# Patient Record
Sex: Female | Born: 1990 | Race: Black or African American | Hispanic: No | Marital: Single | Smoking: Never smoker
Health system: Southern US, Community
[De-identification: ages and names within clinical notes are randomized; demographics above are authoritative.]

## PROBLEM LIST (undated history)

## (undated) ENCOUNTER — Inpatient Hospital Stay: Payer: Self-pay

## (undated) DIAGNOSIS — E876 Hypokalemia: Secondary | ICD-10-CM

## (undated) DIAGNOSIS — F1290 Cannabis use, unspecified, uncomplicated: Secondary | ICD-10-CM

## (undated) DIAGNOSIS — F32A Depression, unspecified: Secondary | ICD-10-CM

## (undated) DIAGNOSIS — D649 Anemia, unspecified: Secondary | ICD-10-CM

## (undated) DIAGNOSIS — F419 Anxiety disorder, unspecified: Secondary | ICD-10-CM

## (undated) DIAGNOSIS — R748 Abnormal levels of other serum enzymes: Secondary | ICD-10-CM

## (undated) DIAGNOSIS — O0930 Supervision of pregnancy with insufficient antenatal care, unspecified trimester: Secondary | ICD-10-CM

## (undated) DIAGNOSIS — K8063 Calculus of gallbladder and bile duct with acute cholecystitis with obstruction: Secondary | ICD-10-CM

## (undated) DIAGNOSIS — O09299 Supervision of pregnancy with other poor reproductive or obstetric history, unspecified trimester: Secondary | ICD-10-CM

## (undated) DIAGNOSIS — K219 Gastro-esophageal reflux disease without esophagitis: Secondary | ICD-10-CM

## (undated) HISTORY — PX: KELOID EXCISION: SHX1856

---
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
## 2014-09-20 DIAGNOSIS — O321XX0 Maternal care for breech presentation, not applicable or unspecified: Secondary | ICD-10-CM

## 2017-06-18 ENCOUNTER — Emergency Department
Admission: EM | Admit: 2017-06-18 | Discharge: 2017-06-18 | Disposition: A | Discharge status: Home / Self Care | Payer: Self-pay | Attending: Emergency Medicine | Admitting: Emergency Medicine

## 2017-06-18 ENCOUNTER — Encounter: Payer: Self-pay | Admitting: Emergency Medicine

## 2017-06-18 DIAGNOSIS — B349 Viral infection, unspecified: Secondary | ICD-10-CM | POA: Insufficient documentation

## 2017-06-18 DIAGNOSIS — R509 Fever, unspecified: Secondary | ICD-10-CM | POA: Insufficient documentation

## 2017-06-18 DIAGNOSIS — R112 Nausea with vomiting, unspecified: Secondary | ICD-10-CM | POA: Insufficient documentation

## 2017-06-18 DIAGNOSIS — R197 Diarrhea, unspecified: Secondary | ICD-10-CM | POA: Insufficient documentation

## 2017-06-18 LAB — CBC
HCT: 36.9 % (ref 35.0–47.0)
Hemoglobin: 12.3 g/dL (ref 12.0–16.0)
MCH: 25.7 pg — ABNORMAL LOW (ref 26.0–34.0)
MCHC: 33.4 g/dL (ref 32.0–36.0)
MCV: 76.9 fL — ABNORMAL LOW (ref 80.0–100.0)
PLATELETS: 280 10*3/uL (ref 150–440)
RBC: 4.79 MIL/uL (ref 3.80–5.20)
RDW: 15.2 % — AB (ref 11.5–14.5)
WBC: 8.8 10*3/uL (ref 3.6–11.0)

## 2017-06-18 LAB — URINALYSIS, COMPLETE (UACMP) WITH MICROSCOPIC
BILIRUBIN URINE: NEGATIVE
Bacteria, UA: NONE SEEN
Glucose, UA: NEGATIVE mg/dL
HGB URINE DIPSTICK: NEGATIVE
Ketones, ur: 5 mg/dL — AB
Nitrite: NEGATIVE
PH: 6 (ref 5.0–8.0)
Protein, ur: 30 mg/dL — AB
SPECIFIC GRAVITY, URINE: 1.028 (ref 1.005–1.030)

## 2017-06-18 LAB — COMPREHENSIVE METABOLIC PANEL
ALT: 13 U/L — AB (ref 14–54)
AST: 14 U/L — AB (ref 15–41)
Albumin: 4.2 g/dL (ref 3.5–5.0)
Alkaline Phosphatase: 69 U/L (ref 38–126)
Anion gap: 10 (ref 5–15)
BILIRUBIN TOTAL: 0.6 mg/dL (ref 0.3–1.2)
BUN: 7 mg/dL (ref 6–20)
CO2: 25 mmol/L (ref 22–32)
Calcium: 9.2 mg/dL (ref 8.9–10.3)
Chloride: 103 mmol/L (ref 101–111)
Creatinine, Ser: 0.72 mg/dL (ref 0.44–1.00)
GFR calc Af Amer: >60 mL/min (ref >60)
Glucose, Bld: 92 mg/dL (ref 65–99)
Potassium: 3.6 mmol/L (ref 3.5–5.1)
Sodium: 138 mmol/L (ref 135–145)
TOTAL PROTEIN: 7.9 g/dL (ref 6.5–8.1)

## 2017-06-18 LAB — LIPASE, BLOOD: Lipase: 15 U/L (ref 11–51)

## 2017-06-18 LAB — POCT PREGNANCY, URINE: Preg Test, Ur: NEGATIVE

## 2017-06-18 MED ORDER — ONDANSETRON 4 MG PO TBDP
4.0000 mg | ORAL_TABLET | Freq: Once | ORAL | Status: AC
  Administered 2017-06-18: 4 mg via ORAL

## 2017-06-18 MED ORDER — ONDANSETRON 4 MG PO TBDP: ORAL_TABLET | ORAL | 0 refills | Status: DC

## 2017-06-18 MED ORDER — ONDANSETRON 4 MG PO TBDP
ORAL_TABLET | ORAL | Status: AC
  Filled 2017-06-18: qty 1

## 2017-06-18 NOTE — Discharge Instructions (Signed)

## 2017-06-18 NOTE — ED Triage Notes (Signed)
Pt to ed with c/o vomiting and diarrhea x 2 days with abd pain and fever.

## 2017-06-18 NOTE — ED Notes (Signed)
Pt tolerating fluids, denies nausea at this time.  

## 2017-06-18 NOTE — ED Provider Notes (Addendum)
[facility] Emergency Department Provider Note  ____________________________________________   First MD Initiated Contact with Patient 06/18/17 1349     (approximate)  I have reviewed the triage vital signs and the nursing notes.   HISTORY  Chief Complaint Emesis; Diarrhea; and Fever    HPI Susan Oneal is a 26 y.o. female With no chronic medical issues who presents for evaluation of about 4 days ago Friday if symptoms that include some nasal congestion and runny nose, mild cough occasionally productive of white sputum, and about 2 daysof nausea, vomiting, and diarrhea.  She reports that the respiratory symptoms started, but then seemed to have "moved down" and developed into the vomiting and diarrhea.  She reports that over the last 2 days she had about 4 episodes of vomiting each day and about 3 loose stools.    Nothing in particular makes the patient's symptoms better nor worse.  She is not having any abdominal pain except that yesterday she noticed that her muscles are sore when she is vomiting.  She feels fine otherwise and denies fever/chills, neck pain, headache, shortness of breath, chest pain, and dysuria.  She reports that the symptoms are moderate in general but that the vomiting has been severe.  However she has not vomited today but she has not tried eating or drinking very much.  She has no nausea at this time.  She reports that she works at [employer] and comes into contact with a lot of patients and other providers and staff.   History reviewed. No pertinent past medical history.  There are no active problems to display for this patient.   History reviewed. No pertinent surgical history.  Prior to Admission medications   Medication Sig Start Date End Date Taking? Authorizing Provider  ondansetron (ZOFRAN ODT) 4 MG disintegrating tablet Allow 1-2 tablets to dissolve in your mouth every 8 hours as needed for nausea/vomiting 06/18/17   Loleta Rose, MD    Allergies Patient has no known allergies.  History reviewed. No pertinent family history.  Social History Social History  Substance Use Topics  . Smoking status: Never Smoker  . Smokeless tobacco: Never Used  . Alcohol use No    Review of Systems Constitutional: No fever/chills Eyes: No visual changes. ENT: No sore throat. some mild nasal congestion and runny nose that seems to be improving Cardiovascular: Denies chest pain. Respiratory: Denies shortness of breath. mild cough occasionally productive of some white sputum but that has been improving Gastrointestinal: No abdominal pain.  several days of nausea, vomiting, and diarrhea, although it seems improved today Genitourinary: Negative for dysuria. Musculoskeletal: Negative for neck pain.  Negative for back pain. Integumentary: Negative for rash. Neurological: Negative for headaches, focal weakness or numbness.   ____________________________________________   PHYSICAL EXAM:  VITAL SIGNS: ED Triage Vitals  Enc Vitals Group     BP 06/18/17 1142 125/78     Pulse Rate 06/18/17 1142 92     Resp 06/18/17 1142 18     Temp 06/18/17 1142 98.2 F (36.8 C)     Temp Source 06/18/17 1142 Oral     SpO2 06/18/17 1142 100 %     Weight 06/18/17 1142 81.2 kg (179 lb)     Height --      Head Circumference --      Peak Flow --      Pain Score 06/18/17 1141 7     Pain Loc --      Pain  Edu? --      Excl. in GC? --     Constitutional: Alert and oriented. Well appearing and in no acute distress. Eyes: Conjunctivae are normal.  Head: Atraumatic. Nose: No congestion/rhinnorhea. Mouth/Throat: Mucous membranes are moist. Neck: No stridor.  No meningeal signs.   Cardiovascular: Normal rate, regular rhythm. Good peripheral circulation. Grossly normal heart sounds. Respiratory: Normal respiratory effort.  No retractions. Lungs CTAB. Gastrointestinal: Soft and nontender throughout all quadrants, specifically with no right  upper quadrant tenderness, negative Murphy sign, and no tenderness at McBurney's point.  No rebound or guarding. No distention.  GU:  deferred Musculoskeletal: No lower extremity tenderness nor edema. No gross deformities of extremities. Neurologic:  Normal speech and language. No gross focal neurologic deficits are appreciated.  Skin:  Skin is warm, dry and intact. No rash noted. Psychiatric: Mood and affect are normal. Speech and behavior are normal.  ____________________________________________   LABS (all labs ordered are listed, but only abnormal results are displayed)  Labs Reviewed  COMPREHENSIVE METABOLIC PANEL - Abnormal; Notable for the following:       Result Value   AST 14 (*)    ALT 13 (*)    All other components within normal limits  CBC - Abnormal; Notable for the following:    MCV 76.9 (*)    MCH 25.7 (*)    RDW 15.2 (*)    All other components within normal limits  URINALYSIS, COMPLETE (UACMP) WITH MICROSCOPIC - Abnormal; Notable for the following:    Color, Urine AMBER (*)    APPearance HAZY (*)    Ketones, ur 5 (*)    Protein, ur 30 (*)    Leukocytes, UA SMALL (*)    Squamous Epithelial / LPF 6-30 (*)    All other components within normal limits  URINE CULTURE  LIPASE, BLOOD  POC URINE PREG, ED  POCT PREGNANCY, URINE   ____________________________________________  EKG  None - EKG not ordered by ED physician ____________________________________________  RADIOLOGY   No results found.  ____________________________________________   PROCEDURES  Critical Care performed: No   Procedure(s) performed:   Procedures   ____________________________________________   INITIAL IMPRESSION / ASSESSMENT AND PLAN / ED COURSE  Pertinent labs & imaging results that were available during my care of the patient were reviewed by me and considered in my medical decision making (see chart for details).  the patient is quite well-appearing today and states  that she feels well too, she is just concerned because of all the vomiting over the last couple of days.  It sounds like she has a viral illness similar to that which we have seen in the community (and ED staff) recently that starts with upper respiratory symptoms and develops into some GI symptoms.  There is no sign of acute or emergent medical condition.  Differential diagnosis includes but is not limited to gallbladder disease, gastritis, nonspecific colitis, etc., but she has no tenderness to palpation of the abdomen and I suspect that with a Zofran she will be able to tolerate oral intake.  I counseled her about this and I will discharge her with Zofran.  I gave my usual and customary return precautions.  She understands and agrees with the plan.  I personally reviewed her lab results and they are only notable for some squamous cells in the urine and 5 ketones but as long as she is tolerating oral intake she does not require IV hydration at this time.  Urine pregnancy test is negative  and metabolic panel is within normal limits other than a very slightly low AST and ALT.   Clinical Course as of Jun 18 1442  Thu Jun 18, 2017  1441 Patient tolerated PO challenge.  Will d/c as planned.  Gave usual/customary return precautions.  [CF]    Clinical Course User Index [CF] Loleta Rose, MD    ____________________________________________  FINAL CLINICAL IMPRESSION(S) / ED DIAGNOSES  Final diagnoses:  Viral illness  Nausea vomiting and diarrhea     MEDICATIONS GIVEN DURING THIS VISIT:  Medications  ondansetron (ZOFRAN-ODT) disintegrating tablet 4 mg (4 mg Oral Given 06/18/17 1402)     NEW OUTPATIENT MEDICATIONS STARTED DURING THIS VISIT:  New Prescriptions   ONDANSETRON (ZOFRAN ODT) 4 MG DISINTEGRATING TABLET    Allow 1-2 tablets to dissolve in your mouth every 8 hours as needed for nausea/vomiting    Modified Medications   No medications on file    Discontinued Medications   No  medications on file     Note:  This document was prepared using Dragon voice recognition software and may include unintentional dictation errors.    Loleta Rose, MD 06/18/17 1406    Loleta Rose, MD 06/18/17 [id number]

## 2017-06-18 NOTE — ED Notes (Signed)
Pt given po ice and ginger ale.

## 2017-06-18 NOTE — ED Notes (Signed)
Pt signed paper copy of d/c signature due to computer malfnx

## 2017-06-18 NOTE — ED Notes (Signed)
Pt to ed with c/o vomiting and diarrhea x 2 days.  Pt states vomited x 4 in the last 24 hours and had diarrhea x 3 in the last 24 hours.  Pt also reports burning in her throat and mild abd pain. Skin warm and dry, no vomiting or nausea noted at this time.

## 2017-06-20 LAB — URINE CULTURE: SPECIAL REQUESTS: NORMAL

## 2017-12-14 ENCOUNTER — Other Ambulatory Visit: Payer: Self-pay | Admitting: Internal Medicine

## 2017-12-14 DIAGNOSIS — R109 Unspecified abdominal pain: Secondary | ICD-10-CM

## 2017-12-14 DIAGNOSIS — R634 Abnormal weight loss: Secondary | ICD-10-CM

## 2018-02-10 ENCOUNTER — Encounter: Payer: Self-pay | Admitting: Emergency Medicine

## 2018-02-10 ENCOUNTER — Emergency Department
Admission: EM | Admit: 2018-02-10 | Discharge: 2018-02-10 | Disposition: A | Discharge status: Home / Self Care | Payer: Self-pay | Attending: Emergency Medicine | Admitting: Emergency Medicine

## 2018-02-10 ENCOUNTER — Other Ambulatory Visit: Payer: Self-pay

## 2018-02-10 DIAGNOSIS — K529 Noninfective gastroenteritis and colitis, unspecified: Secondary | ICD-10-CM | POA: Insufficient documentation

## 2018-02-10 LAB — COMPREHENSIVE METABOLIC PANEL
ALT: 9 U/L — ABNORMAL LOW (ref 14–54)
ANION GAP: 7 (ref 5–15)
AST: 12 U/L — AB (ref 15–41)
Albumin: 3.8 g/dL (ref 3.5–5.0)
Alkaline Phosphatase: 59 U/L (ref 38–126)
BUN: 7 mg/dL (ref 6–20)
CHLORIDE: 103 mmol/L (ref 101–111)
CO2: 27 mmol/L (ref 22–32)
Calcium: 9 mg/dL (ref 8.9–10.3)
Creatinine, Ser: 0.68 mg/dL (ref 0.44–1.00)
Glucose, Bld: 95 mg/dL (ref 65–99)
POTASSIUM: 3.8 mmol/L (ref 3.5–5.1)
Sodium: 137 mmol/L (ref 135–145)
TOTAL PROTEIN: 7.4 g/dL (ref 6.5–8.1)
Total Bilirubin: 0.7 mg/dL (ref 0.3–1.2)

## 2018-02-10 LAB — CBC
HEMATOCRIT: 35.1 % (ref 35.0–47.0)
HEMOGLOBIN: 11.5 g/dL — AB (ref 12.0–16.0)
MCH: 27 pg (ref 26.0–34.0)
MCHC: 32.8 g/dL (ref 32.0–36.0)
MCV: 82.3 fL (ref 80.0–100.0)
Platelets: 296 10*3/uL (ref 150–440)
RBC: 4.26 MIL/uL (ref 3.80–5.20)
RDW: 15.3 % — ABNORMAL HIGH (ref 11.5–14.5)
WBC: 7.6 10*3/uL (ref 3.6–11.0)

## 2018-02-10 LAB — URINALYSIS, COMPLETE (UACMP) WITH MICROSCOPIC
Bacteria, UA: NONE SEEN
Bilirubin Urine: NEGATIVE
Glucose, UA: NEGATIVE mg/dL
Hgb urine dipstick: NEGATIVE
KETONES UR: NEGATIVE mg/dL
Leukocytes, UA: NEGATIVE
Nitrite: NEGATIVE
PH: 7 (ref 5.0–8.0)
Protein, ur: NEGATIVE mg/dL
SPECIFIC GRAVITY, URINE: 1.018 (ref 1.005–1.030)

## 2018-02-10 LAB — POCT PREGNANCY, URINE: Preg Test, Ur: NEGATIVE

## 2018-02-10 LAB — LIPASE, BLOOD: LIPASE: 20 U/L (ref 11–51)

## 2018-02-10 MED ORDER — ONDANSETRON HCL 4 MG PO TABS: 4.0000 mg | ORAL_TABLET | Freq: Three times a day (TID) | ORAL | 0 refills | Status: AC | PRN

## 2018-02-10 MED ORDER — DICYCLOMINE HCL 10 MG PO CAPS: 10.0000 mg | ORAL_CAPSULE | Freq: Three times a day (TID) | ORAL | 0 refills | Status: DC | PRN

## 2018-02-10 MED ORDER — DICYCLOMINE HCL 10 MG PO CAPS
10.0000 mg | ORAL_CAPSULE | Freq: Once | ORAL | Status: AC
  Administered 2018-02-10: 10 mg via ORAL
  Filled 2018-02-10: qty 1

## 2018-02-10 NOTE — ED Provider Notes (Signed)
[facility] Emergency Department Provider Note ____________________________________________   First MD Initiated Contact with Patient 02/10/18 1521     (approximate)  I have reviewed the triage vital signs and the nursing notes.   HISTORY  Chief Complaint Abdominal Pain    HPI Susan Oneal is a 27 y.o. female with apparent past history of cholelithiasis but no other significant medical problems who presents with abdominal pain since last night, described as crampy and generalized, and intermittent.  It is not present currently.  She reports several episodes of vomiting, and a few episodes of nonbloody diarrhea today as well.  She states that symptoms started after she ate fried seafood last night, which she does not normally eat.  She denies fever, chest pain or difficulty breathing, or urinary symptoms.  No sick contacts or other recent illness.  History reviewed. No pertinent past medical history.  There are no active problems to display for this patient.   History reviewed. No pertinent surgical history.  Prior to Admission medications   Medication Sig Start Date End Date Taking? Authorizing Provider  dicyclomine (BENTYL) 10 MG capsule Take 1 capsule (10 mg total) by mouth 3 (three) times daily as needed for up to 5 days (Abdominal cramps). 02/10/18 02/15/18  Dionne Bucy, MD  ondansetron (ZOFRAN ODT) 4 MG disintegrating tablet Allow 1-2 tablets to dissolve in your mouth every 8 hours as needed for nausea/vomiting 06/18/17   Loleta Rose, MD  ondansetron (ZOFRAN) 4 MG tablet Take 1 tablet (4 mg total) by mouth every 8 (eight) hours as needed for up to 4 days for nausea or vomiting. 02/10/18 02/14/18  Dionne Bucy, MD    Allergies Patient has no known allergies.  No family history on file.  Social History Social History   Tobacco Use  . Smoking status: Never Smoker  . Smokeless tobacco: Never Used  Substance Use Topics  . Alcohol  use: No  . Drug use: No    Review of Systems  Constitutional: No fever. Eyes: No redness. ENT: No sore throat. Cardiovascular: Denies chest pain. Respiratory: Denies shortness of breath. Gastrointestinal: Positive for nausea, vomiting, diarrhea.  Genitourinary: Negative for dysuria.  Musculoskeletal: Negative for back pain. Skin: Negative for rash. Neurological: Negative for headache.   ____________________________________________   PHYSICAL EXAM:  VITAL SIGNS: ED Triage Vitals  Enc Vitals Group     BP 02/10/18 1219 115/73     Pulse Rate 02/10/18 1219 68     Resp 02/10/18 1219 18     Temp 02/10/18 1219 98.3 F (36.8 C)     Temp Source 02/10/18 1219 Oral     SpO2 02/10/18 1219 100 %     Weight 02/10/18 1220 139 lb (63 kg)     Height 02/10/18 1220  (1.626 m)     Head Circumference --      Peak Flow --      Pain Score 02/10/18 1219 7     Pain Loc --      Pain Edu? --      Excl. in GC? --     Constitutional: Alert and oriented. Well appearing and in no acute distress. Eyes: Conjunctivae are normal.  No scleral icterus. Head: Atraumatic. Nose: No congestion/rhinnorhea. Mouth/Throat: Mucous membranes are moist.   Neck: Normal range of motion.  Cardiovascular:  Good peripheral circulation. Respiratory: Normal respiratory effort. Gastrointestinal: Soft and nontender. No distention.  Genitourinary: No CVA tenderness. Musculoskeletal:  Extremities warm and well perfused.  Neurologic:  Normal speech and language. No gross focal neurologic deficits are appreciated.  Skin:  Skin is warm and dry. No rash noted. Psychiatric: Mood and affect are normal. Speech and behavior are normal.  ____________________________________________   LABS (all labs ordered are listed, but only abnormal results are displayed)  Labs Reviewed  COMPREHENSIVE METABOLIC PANEL - Abnormal; Notable for the following components:      Result Value   AST 12 (*)    ALT 9 (*)    All other  components within normal limits  CBC - Abnormal; Notable for the following components:   Hemoglobin 11.5 (*)    RDW 15.3 (*)    All other components within normal limits  URINALYSIS, COMPLETE (UACMP) WITH MICROSCOPIC - Abnormal; Notable for the following components:   Color, Urine YELLOW (*)    APPearance HAZY (*)    All other components within normal limits  LIPASE, BLOOD  POC URINE PREG, ED  POCT PREGNANCY, URINE   ____________________________________________  EKG   ____________________________________________  RADIOLOGY    ____________________________________________   PROCEDURES  Procedure(s) performed: No  Procedures  Critical Care performed: No ____________________________________________   INITIAL IMPRESSION / ASSESSMENT AND PLAN / ED COURSE  Pertinent labs & imaging results that were available during my care of the patient were reviewed by me and considered in my medical decision making (see chart for details).  27 year old female with no significant PMH except for cholelithiasis and possible GERD (patient states she has been treated with Prilosec in the past) presents with intermittent crampy generalized abdominal pain, vomiting, and diarrhea, since she ate seafood last night.  On exam here, vital signs are normal, the patient is well-appearing, and her abdomen is soft and nontender.  The remainder the exam is unremarkable.  Past medical records reviewed in Epic and are noncontributory.  Differential includes most likely gastroenteritis versus foodborne illness.  There is no evidence of biliary colic or acute cholecystitis.  Labs obtained from triage are within normal limits.  Given the lack of abdominal tenderness and the reassuring labs, there is no indication for imaging.  Plan: Discharge home with symptomatic treatment with Zofran and Bentyl.  The patient agrees with the plan, and feels well to go home.  Return precautions given, and she expresses  understanding.      ____________________________________________   FINAL CLINICAL IMPRESSION(S) / ED DIAGNOSES  Final diagnoses:  Gastroenteritis      NEW MEDICATIONS STARTED DURING THIS VISIT:  New Prescriptions   DICYCLOMINE (BENTYL) 10 MG CAPSULE    Take 1 capsule (10 mg total) by mouth 3 (three) times daily as needed for up to 5 days (Abdominal cramps).   ONDANSETRON (ZOFRAN) 4 MG TABLET    Take 1 tablet (4 mg total) by mouth every 8 (eight) hours as needed for up to 4 days for nausea or vomiting.     Note:  This document was prepared using Dragon voice recognition software and may include unintentional dictation errors.     Dionne Bucy, MD 02/10/18 1549

## 2018-02-10 NOTE — Discharge Instructions (Signed)
You may take the Zofran as needed for nausea and the Bentyl as needed for abdominal pain or cramping over the next few days.  Return to the ER for new, worsening, persistent severe pain, constant pain, fever, vomiting or inability to take anything by mouth, or any other new or worsening symptoms that concern you.

## 2018-02-10 NOTE — ED Triage Notes (Signed)
Pt states vomiting and diarrhea that began after eating at [employer] last night. States cramping all over in abdomen. Appears in NAD.

## 2018-07-08 ENCOUNTER — Encounter: Payer: Self-pay | Admitting: Emergency Medicine

## 2018-07-08 DIAGNOSIS — Z3A00 Weeks of gestation of pregnancy not specified: Secondary | ICD-10-CM | POA: Insufficient documentation

## 2018-07-08 DIAGNOSIS — K8050 Calculus of bile duct without cholangitis or cholecystitis without obstruction: Secondary | ICD-10-CM | POA: Diagnosis not present

## 2018-07-08 DIAGNOSIS — O26611 Liver and biliary tract disorders in pregnancy, first trimester: Secondary | ICD-10-CM | POA: Diagnosis not present

## 2018-07-08 DIAGNOSIS — O219 Vomiting of pregnancy, unspecified: Secondary | ICD-10-CM | POA: Diagnosis present

## 2018-07-08 LAB — COMPREHENSIVE METABOLIC PANEL
ALK PHOS: 45 U/L (ref 38–126)
ALT: 7 U/L (ref 0–44)
ANION GAP: 7 (ref 5–15)
AST: 13 U/L — ABNORMAL LOW (ref 15–41)
Albumin: 3.7 g/dL (ref 3.5–5.0)
BUN: 5 mg/dL — ABNORMAL LOW (ref 6–20)
CALCIUM: 9 mg/dL (ref 8.9–10.3)
CHLORIDE: 104 mmol/L (ref 98–111)
CO2: 25 mmol/L (ref 22–32)
Creatinine, Ser: 0.59 mg/dL (ref 0.44–1.00)
GFR calc non Af Amer: >60 mL/min (ref >60)
GLUCOSE: 86 mg/dL (ref 70–99)
POTASSIUM: 3.3 mmol/L — AB (ref 3.5–5.1)
SODIUM: 136 mmol/L (ref 135–145)
Total Bilirubin: 0.5 mg/dL (ref 0.3–1.2)
Total Protein: 7.2 g/dL (ref 6.5–8.1)

## 2018-07-08 LAB — CBC
HEMATOCRIT: 35.9 % — AB (ref 36.0–46.0)
HEMOGLOBIN: 12 g/dL (ref 12.0–15.0)
MCH: 29.1 pg (ref 26.0–34.0)
MCHC: 33.4 g/dL (ref 30.0–36.0)
MCV: 87.1 fL (ref 80.0–100.0)
NRBC: 0 % (ref 0.0–0.2)
PLATELETS: 243 10*3/uL (ref 150–400)
RBC: 4.12 MIL/uL (ref 3.87–5.11)
RDW: 14 % (ref 11.5–15.5)
WBC: 10.7 10*3/uL — ABNORMAL HIGH (ref 4.0–10.5)

## 2018-07-08 LAB — URINALYSIS, COMPLETE (UACMP) WITH MICROSCOPIC
BACTERIA UA: NONE SEEN
BILIRUBIN URINE: NEGATIVE
Glucose, UA: NEGATIVE mg/dL
Hgb urine dipstick: NEGATIVE
KETONES UR: NEGATIVE mg/dL
Nitrite: NEGATIVE
Protein, ur: 30 mg/dL — AB
SPECIFIC GRAVITY, URINE: 1.026 (ref 1.005–1.030)
pH: 6 (ref 5.0–8.0)

## 2018-07-08 LAB — POC URINE PREG, ED: PREG TEST UR: POSITIVE — AB

## 2018-07-08 LAB — LIPASE, BLOOD: LIPASE: 22 U/L (ref 11–51)

## 2018-07-08 NOTE — ED Triage Notes (Signed)
Pt arrives with complaints of vomiting. Pt states "it's been happening for awhile." Pt reports issues with vomiting for the last 2 months.  Pt also reports abdominal pain that started a month ago. Pt states the pain feels like cramping. PT unsure if she is pregnant states last period is sept 3rd

## 2018-07-09 ENCOUNTER — Emergency Department: Payer: Medicaid Other

## 2018-07-09 ENCOUNTER — Encounter: Payer: Self-pay | Admitting: Emergency Medicine

## 2018-07-09 ENCOUNTER — Other Ambulatory Visit: Payer: Self-pay

## 2018-07-09 ENCOUNTER — Emergency Department
Admission: EM | Admit: 2018-07-09 | Discharge: 2018-07-09 | Disposition: A | Discharge status: Home / Self Care | Payer: Medicaid Other | Attending: Emergency Medicine | Admitting: Emergency Medicine

## 2018-07-09 ENCOUNTER — Emergency Department
Admission: EM | Admit: 2018-07-09 | Discharge: 2018-07-09 | Disposition: A | Discharge status: Home / Self Care | Payer: Medicaid Other | Source: Home / Self Care | Attending: Emergency Medicine | Admitting: Emergency Medicine

## 2018-07-09 ENCOUNTER — Encounter: Payer: Self-pay | Admitting: Radiology

## 2018-07-09 DIAGNOSIS — R52 Pain, unspecified: Secondary | ICD-10-CM

## 2018-07-09 DIAGNOSIS — O9989 Other specified diseases and conditions complicating pregnancy, childbirth and the puerperium: Secondary | ICD-10-CM | POA: Insufficient documentation

## 2018-07-09 DIAGNOSIS — Z3A18 18 weeks gestation of pregnancy: Secondary | ICD-10-CM

## 2018-07-09 DIAGNOSIS — Z3492 Encounter for supervision of normal pregnancy, unspecified, second trimester: Secondary | ICD-10-CM

## 2018-07-09 DIAGNOSIS — K8050 Calculus of bile duct without cholangitis or cholecystitis without obstruction: Secondary | ICD-10-CM

## 2018-07-09 DIAGNOSIS — R102 Pelvic and perineal pain: Secondary | ICD-10-CM | POA: Insufficient documentation

## 2018-07-09 DIAGNOSIS — Z3A01 Less than 8 weeks gestation of pregnancy: Secondary | ICD-10-CM

## 2018-07-09 LAB — ABO/RH: ABO/RH(D): O POS

## 2018-07-09 LAB — HCG, QUANTITATIVE, PREGNANCY: HCG, BETA CHAIN, QUANT, S: 34876 m[IU]/mL — AB (ref <5)

## 2018-07-09 MED ORDER — DOXYLAMINE-PYRIDOXINE 10-10 MG PO TBEC: DELAYED_RELEASE_TABLET | ORAL | 1 refills | Status: DC

## 2018-07-09 NOTE — ED Notes (Signed)
No peripheral IV placed this visit.   Discharge instructions reviewed with patient. Questions fielded by this RN. Patient verbalizes understanding of instructions. Patient discharged home in stable condition per [facility]ane. No acute distress noted at time of discharge.    

## 2018-07-09 NOTE — ED Triage Notes (Signed)
Patient presents to the ED with lower abdominal pain x 1 month.  Patient was seen in the ED yesterday evening and was diagnosed with galstones and pregnancy yesterday.  Patient went to the health department today and was told she was measuring at [redacted] weeks pregnant.  Patient was told in the ED she was likely [redacted] weeks pregnant based on LMP.  Patient was sent back to ED by health department to recheck gestational age.  Patient states pain is remaining the same.  Patient is in no acute distress in triage.  Patient is smiling and appears relaxed.

## 2018-07-09 NOTE — ED Notes (Signed)
Pt reports she was seen in ER last night for RUQ pain reports diagnosed with gallstones and also found out she is pregnant reports she was informed she was [redacted] weeks pregnant went to Health Department due to lower abdominal discomfort and she was informed that she is about [redacted] weeks pregnant, reports cramping feeling to lower abdominal area denies any vaginal bleeding or discharge, reports last episode of emesis 3 hrs ago

## 2018-07-09 NOTE — Discharge Instructions (Signed)
Congratulations on your pregnancy!  While you are pregnant the only pain medication that is really safe for the baby is Tylenol.  Please make an appointment to establish care with [facility] gynecologist within the next week for recheck and please also follow-up with the general surgeon to discuss your options for your gallbladder.  Return to the emergency department for any concerns.  It was a pleasure to take care of you today, and thank you for coming to our emergency department.  If you have any questions or concerns before leaving please ask the nurse to grab me and I'm more than happy to go through your aftercare instructions again.  If you were prescribed any opioid pain medication today such as Norco, Vicodin, Percocet, morphine, hydrocodone, or oxycodone please make sure you do not drive when you are taking this medication as it can alter your ability to drive safely.  If you have any concerns once you are home that you are not improving or are in fact getting worse before you can make it to your follow-up appointment, please do not hesitate to call 911 and come back for further evaluation.  Merrily Brittle, MD  Results for orders placed or performed during the hospital encounter of 07/09/18  Lipase, blood  Result Value Ref Range   Lipase 22 11 - 51 U/L  Comprehensive metabolic panel  Result Value Ref Range   Sodium 136 135 - 145 mmol/L   Potassium 3.3 (L) 3.5 - 5.1 mmol/L   Chloride 104 98 - 111 mmol/L   CO2 25 22 - 32 mmol/L   Glucose, Bld 86 70 - 99 mg/dL   BUN 5 (L) 6 - 20 mg/dL   Creatinine, Ser 1.61 0.44 - 1.00 mg/dL   Calcium 9.0 8.9 - 09.6 mg/dL   Total Protein 7.2 6.5 - 8.1 g/dL   Albumin 3.7 3.5 - 5.0 g/dL   AST 13 (L) 15 - 41 U/L   ALT 7 0 - 44 U/L   Alkaline Phosphatase 45 38 - 126 U/L   Total Bilirubin 0.5 0.3 - 1.2 mg/dL   GFR calc non Af Amer >60 >60 mL/min   GFR calc Af Amer >60 >60 mL/min   Anion gap 7 5 - 15  CBC  Result Value Ref Range   WBC 10.7 (H) 4.0 - 10.5  K/uL   RBC 4.12 3.87 - 5.11 MIL/uL   Hemoglobin 12.0 12.0 - 15.0 g/dL   HCT 04.5 (L) 40.9 - 81.1 %   MCV 87.1 80.0 - 100.0 fL   MCH 29.1 26.0 - 34.0 pg   MCHC 33.4 30.0 - 36.0 g/dL   RDW 91.4 78.2 - 95.6 %   Platelets 243 150 - 400 K/uL   nRBC 0.0 0.0 - 0.2 %  Urinalysis, Complete w Microscopic  Result Value Ref Range   Color, Urine YELLOW (A) YELLOW   APPearance CLEAR (A) CLEAR   Specific Gravity, Urine 1.026 1.005 - 1.030   pH 6.0 5.0 - 8.0   Glucose, UA NEGATIVE NEGATIVE mg/dL   Hgb urine dipstick NEGATIVE NEGATIVE   Bilirubin Urine NEGATIVE NEGATIVE   Ketones, ur NEGATIVE NEGATIVE mg/dL   Protein, ur 30 (A) NEGATIVE mg/dL   Nitrite NEGATIVE NEGATIVE   Leukocytes, UA TRACE (A) NEGATIVE   RBC / HPF 0-5 0 - 5 RBC/hpf   WBC, UA 0-5 0 - 5 WBC/hpf   Bacteria, UA NONE SEEN NONE SEEN   Squamous Epithelial / LPF 0-5 0 - 5  Mucus PRESENT    Hyaline Casts, UA PRESENT   POC urine preg, ED  Result Value Ref Range   Preg Test, Ur Positive (A) Negative   US Abdomen Limited Ruq  Result Date: 07/09/2018 CLINICAL DATA:  Nonspecific abdominal pain with intermittent vomiting for 1 month. EXAM: ULTRASOUND ABDOMEN LIMITED RIGHT UPPER QUADRANT COMPARISON:  None. FINDINGS: Gallbladder: Gallbladder is contracted and filled with stones forming the wall echo shadow complex. No gallbladder wall thickening or edema. Murphy's sign is negative. Common bile duct: Diameter: 3.5 mm, normal Liver: No focal lesion identified. Within normal limits in parenchymal echogenicity. Portal vein is patent on color Doppler imaging with normal direction of blood flow towards the liver. IMPRESSION: Cholelithiasis without additional evidence of cholecystitis. Electronically Signed   By: Burman Nieves M.D.   On: 07/09/2018 02:07

## 2018-07-09 NOTE — ED Provider Notes (Signed)
[facility] Emergency Department Provider Note  ____________________________________________   First MD Initiated Contact with Patient 07/09/18 0034     (approximate)  I have reviewed the triage vital signs and the nursing notes.   HISTORY  Chief Complaint Emesis and Abdominal Pain   HPI Susan Oneal is a 27 y.o. female who comes to the emergency department with several months of intermittent upper abdominal pain associated with vomiting.  The symptoms seem to be worse after eating.  No fevers or chills.  She is also concerned that she possibly could be pregnant.  Her last menstrual period was 5 weeks ago.  This would be an unplanned but desired pregnancy.  She denies vaginal discharge or vaginal bleeding.  Her pain is mild to moderate upper abdominal postprandial nonradiating.  She denies dysuria frequency or hesitancy.  She denies back pain.    History reviewed. No pertinent past medical history.  There are no active problems to display for this patient.   History reviewed. No pertinent surgical history.  Prior to Admission medications   Medication Sig Start Date End Date Taking? Authorizing Provider  dicyclomine (BENTYL) 10 MG capsule Take 1 capsule (10 mg total) by mouth 3 (three) times daily as needed for up to 5 days (Abdominal cramps). 02/10/18 02/15/18  Dionne Bucy, MD  Doxylamine-Pyridoxine (DICLEGIS) 10-10 MG TBEC 2 tabs orally at hs (Day 1). If this works, continue taking 2 tab qhs. However, if nausea persists Day 2, take 2 tabs at bedtime that night then take three tablets starting on Day 3 (one tablet in the morning and two tablets at bedtime). If these 3 tablets control symptoms on Day 4, continue taking 3 tabs daily. Otherwise take 4 tabs starting on Day 4 (one tablet in the morning, one tablet mid-afternoon and two tablets at bedtime). 07/09/18   Jeanmarie Plant, MD  ondansetron (ZOFRAN ODT) 4 MG disintegrating tablet Allow 1-2 tablets  to dissolve in your mouth every 8 hours as needed for nausea/vomiting 06/18/17   Loleta Rose, MD    Allergies Patient has no known allergies.  No family history on file.  Social History Social History   Tobacco Use  . Smoking status: Never Smoker  . Smokeless tobacco: Never Used  Substance Use Topics  . Alcohol use: No  . Drug use: No    Review of Systems Constitutional: No fever/chills Eyes: No visual changes. ENT: No sore throat. Cardiovascular: Denies chest pain. Respiratory: Denies shortness of breath. Gastrointestinal: Positive for abdominal pain.  Positive for nausea, no vomiting.  No diarrhea.  No constipation. Genitourinary: Negative for dysuria. Musculoskeletal: Negative for back pain. Skin: Negative for rash. Neurological: Negative for headaches, focal weakness or numbness.   ____________________________________________   PHYSICAL EXAM:  VITAL SIGNS: ED Triage Vitals [07/08/18 1856]  Enc Vitals Group     BP 137/89     Pulse Rate 95     Resp 15     Temp 98.6 F (37 C)     Temp Source Oral     SpO2 100 %     Weight 130 lb (59 kg)     Height 5\' 4"  (1.626 m)     Head Circumference      Peak Flow      Pain Score 7     Pain Loc      Pain Edu?      Excl. in GC?     Constitutional: Alert and oriented x4 calm cooperative pleasant speaks  in full clear sentences Eyes: PERRL EOMI. Head: Atraumatic. Nose: No congestion/rhinnorhea. Mouth/Throat: No trismus Neck: No stridor.   Cardiovascular: Normal rate, regular rhythm. Grossly normal heart sounds.  Good peripheral circulation. Respiratory: Normal respiratory effort.  No retractions. Lungs CTAB and moving good air Gastrointestinal: Soft nondistended nontender mild epigastric tenderness negative Murphy's no McBurney's tenderness Musculoskeletal: No lower extremity edema   Neurologic:  Normal speech and language. No gross focal neurologic deficits are appreciated. Skin:  Skin is warm, dry and intact. No  rash noted. Psychiatric: Mood and affect are normal. Speech and behavior are normal.    ____________________________________________   DIFFERENTIAL includes but not limited to  Biliary colic, cholecystitis, ectopic pregnancy, urinary tract infection, appendicitis ____________________________________________   LABS (all labs ordered are listed, but only abnormal results are displayed)  Labs Reviewed  COMPREHENSIVE METABOLIC PANEL - Abnormal; Notable for the following components:      Result Value   Potassium 3.3 (*)    BUN 5 (*)    AST 13 (*)    All other components within normal limits  CBC - Abnormal; Notable for the following components:   WBC 10.7 (*)    HCT 35.9 (*)    All other components within normal limits  URINALYSIS, COMPLETE (UACMP) WITH MICROSCOPIC - Abnormal; Notable for the following components:   Color, Urine YELLOW (*)    APPearance CLEAR (*)    Protein, ur 30 (*)    Leukocytes, UA TRACE (*)    All other components within normal limits  POC URINE PREG, ED - Abnormal; Notable for the following components:   Preg Test, Ur Positive (*)    All other components within normal limits  LIPASE, BLOOD    Lab work reviewed by me shows the patient is pregnant __________________________________________  EKG   ____________________________________________  RADIOLOGY  Right upper quadrant ultrasound reviewed by me shows gallstones with no evidence of cholecystitis ____________________________________________   PROCEDURES  Procedure(s) performed: no  Procedures  Critical Care performed: no  ____________________________________________   INITIAL IMPRESSION / ASSESSMENT AND PLAN / ED COURSE  Pertinent labs & imaging results that were available during my care of the patient were reviewed by me and considered in my medical decision making (see chart for details).   As part of my medical decision making, I reviewed the following data within the electronic  MEDICAL RECORD NUMBER History obtained from family if available, nursing notes, old chart and ekg, as well as notes from prior ED visits.  The patient comes to the emergency department with chronic upper abdominal pain that is postprandial associated with nausea.  Her abdominal exam is benign.  She is pregnant however was not taking fertility medications.  I performed a bedside ultrasound which appeared to show gallstones so I obtained a formal ultrasound which does show a large amount of stones but no evidence of cholecystitis.  She feels improved after Reglan and have advised the patient to follow-up with [facility] gynecology as well as general surgery to discuss her options.  She understands she would not be a surgical candidate during pregnancy.  She is discharged home in improved condition with strict return precautions given.      ____________________________________________   FINAL CLINICAL IMPRESSION(S) / ED DIAGNOSES  Final diagnoses:  Pain  Biliary colic  Less than [redacted] weeks gestation of pregnancy      NEW MEDICATIONS STARTED DURING THIS VISIT:  Discharge Medication List as of 07/09/2018  2:31 AM  Note:  This document was prepared using Dragon voice recognition software and may include unintentional dictation errors.     Merrily Brittle, MD 07/11/18 [phone]

## 2018-07-09 NOTE — ED Notes (Signed)
Pt able to ambulate without difficulty or distress. Pt in NAD at this time A&O x 4.

## 2018-07-09 NOTE — ED Notes (Signed)
Patient transported to Ultrasound 

## 2018-07-09 NOTE — Discharge Instructions (Addendum)
Please start taking a baby vitamin, we are prescribing you medication for your nausea.  Take it as needed.  Talk to your OB/GYN before starting any other new medications.  If you have significant abdominal pain, vomiting, fever you feel worse in any way return to the emergency room.  You do have known gallbladder disease, so as we discussed if you have right upper quadrant abdominal pain, persistent vomiting, or you feel worse it is important that you come back your child is around 18 weeks and 1 day today and it appears healthy but you will need a more formal ultrasound from your OB/GYN.  Your blood type is O+.  If you have severe vaginal bleeding, bleeding more than a pad an hour, gush of fluid or other concerns return to the emergency department.  Please follow closely with OB/GYN listed above and at the health department.

## 2018-07-09 NOTE — ED Provider Notes (Signed)
[facility] Emergency Department Provider Note  ____________________________________________   I have reviewed the triage vital signs and the nursing notes. Where available I have reviewed prior notes and, if possible and indicated, outside hospital notes.    HISTORY  Chief Complaint Abdominal Pain    HPI Susan Oneal is a 27 y.o. female  States that she has been having irregular menstrual periods since sometime in April when her last full normal menstrual period was.  Has not had any bleeding since September.  She found out 2 days ago she was pregnant.  She has been having some nausea during the course of this pregnancy.  She does not know exactly how far along she has by dates.  She was seen here earlier this morning because there was concern about her gallbladder.  Gallbladder ultrasound was performed liver function tests were performed she had cholelithiasis without evidence of cholecystitis.  Since going home she is had no significant illness.  She denies vomiting or any abdominal pain today.  However, she was evaluated again by PCP and given confusion about her dates they sent her back here for formal ultrasound to evaluate her for dates.  Apparently they can do outpatient ultrasounds.  Patient is in no acute distress has no complaints the only reason she is back today is because she was sent in by the health department to get an ultrasound she states.  She does not know her blood type, she states that she is pregnant one other time had a child in [place] and does not know if she required RhoGam no active vaginal bleeding has not had any vaginal bleeding since September     History reviewed. No pertinent past medical history.  There are no active problems to display for this patient.   History reviewed. No pertinent surgical history.  Prior to Admission medications   Medication Sig Start Date End Date Taking? Authorizing Provider  dicyclomine  (BENTYL) 10 MG capsule Take 1 capsule (10 mg total) by mouth 3 (three) times daily as needed for up to 5 days (Abdominal cramps). 02/10/18 02/15/18  Dionne Bucy, MD  ondansetron (ZOFRAN ODT) 4 MG disintegrating tablet Allow 1-2 tablets to dissolve in your mouth every 8 hours as needed for nausea/vomiting 06/18/17   Loleta Rose, MD    Allergies Patient has no known allergies.  No family history on file.  Social History Social History   Tobacco Use  . Smoking status: Never Smoker  . Smokeless tobacco: Never Used  Substance Use Topics  . Alcohol use: No  . Drug use: No    Review of Systems Constitutional: No fever/chills Eyes: No visual changes. ENT: No sore throat. No stiff neck no neck pain Cardiovascular: Denies chest pain. Respiratory: Denies shortness of breath. Gastrointestinal:   no vomiting.  No diarrhea.  No constipation. Genitourinary: Negative for dysuria. Musculoskeletal: Negative lower extremity swelling Skin: Negative for rash. Neurological: Negative for severe headaches, focal weakness or numbness.   ____________________________________________   PHYSICAL EXAM:  VITAL SIGNS: ED Triage Vitals  Enc Vitals Group     BP 07/09/18 1926 121/76     Pulse Rate 07/09/18 1926 83     Resp 07/09/18 1926 20     Temp 07/09/18 1907 99 F (37.2 C)     Temp Source 07/09/18 1907 Oral     SpO2 07/09/18 1926 100 %     Weight 07/09/18 1928 128 lb (58.1 kg)     Height 07/09/18 1928 5'  4" (1.626 m)     Head Circumference --      Peak Flow --      Pain Score 07/09/18 1908 7     Pain Loc --      Pain Edu? --      Excl. in GC? --     Constitutional: Alert and oriented. Well appearing and in no acute distress. Eyes: Conjunctivae are normal Head: Atraumatic HEENT: No congestion/rhinnorhea. Mucous membranes are moist.  Oropharynx non-erythematous Neck:   Nontender with no meningismus, no masses, no stridor Cardiovascular: Normal rate, regular rhythm. Grossly normal  heart sounds.  Good peripheral circulation. Respiratory: Normal respiratory effort.  No retractions. Lungs CTAB. Abdominal: Soft and nontender. No distention. No guarding no rebound, patient's fundal height is approximately 2 cm below the umbilicus making 18 weeks the most likely date.  There is no abdominal pain or tenderness Back:  There is no focal tenderness or step off.  there is no midline tenderness there are no lesions noted. there is no CVA tenderness Musculoskeletal: No lower extremity tenderness, no upper extremity tenderness. No joint effusions, no DVT signs strong distal pulses no edema Neurologic:  Normal speech and language. No gross focal neurologic deficits are appreciated.  Skin:  Skin is warm, dry and intact. No rash noted. Psychiatric: Mood and affect are normal. Speech and behavior are normal.  ____________________________________________   LABS (all labs ordered are listed, but only abnormal results are displayed)  Labs Reviewed  HCG, QUANTITATIVE, PREGNANCY - Abnormal; Notable for the following components:      Result Value   hCG, Beta Chain, Quant, S 16,109 (*)    All other components within normal limits  ABO/RH    Pertinent labs  results that were available during my care of the patient were reviewed by me and considered in my medical decision making (see chart for details). ____________________________________________  EKG  I personally interpreted any EKGs ordered by me or triage  ____________________________________________  RADIOLOGY  Pertinent labs & imaging results that were available during my care of the patient were reviewed by me and considered in my medical decision making (see chart for details). If possible, patient and/or family made aware of any abnormal findings.  US Ob Limited  Result Date: 07/09/2018 CLINICAL DATA:  Abdominal pain, unsure dates. EXAM: LIMITED OBSTETRIC ULTRASOUND FINDINGS: Number of Fetuses: 1 Heart Rate:  144 bpm  Movement: Yes Presentation: Cephalic Placental Location: Anterior Previa: No Amniotic Fluid (Subjective):  Within normal limits. AFI:  cm BPD: 4 cm 18 w  1 d MATERNAL FINDINGS: Cervix:  Appears closed. Uterus/Adnexae: No abnormality visualized. IMPRESSION: 1. Single live intrauterine pregnancy with estimated gestational age of [redacted] weeks and 1 day. 2. No source for abdominal pain identified. This exam is performed on an emergent basis and does not comprehensively evaluate fetal size, dating, or anatomy; follow-up complete OB US should be considered if further fetal assessment is warranted. Electronically Signed   By: Bary Richard M.D.   On: 07/09/2018 20:43   US Abdomen Limited Ruq  Result Date: 07/09/2018 CLINICAL DATA:  Nonspecific abdominal pain with intermittent vomiting for 1 month. EXAM: ULTRASOUND ABDOMEN LIMITED RIGHT UPPER QUADRANT COMPARISON:  None. FINDINGS: Gallbladder: Gallbladder is contracted and filled with stones forming the wall echo shadow complex. No gallbladder wall thickening or edema. Murphy's sign is negative. Common bile duct: Diameter: 3.5 mm, normal Liver: No focal lesion identified. Within normal limits in parenchymal echogenicity. Portal vein is patent on color Doppler imaging with  normal direction of blood flow towards the liver. IMPRESSION: Cholelithiasis without additional evidence of cholecystitis. Electronically Signed   By: Burman Nieves M.D.   On: 07/09/2018 02:07   ____________________________________________    PROCEDURES  Procedure(s) performed: None  Procedures  Critical Care performed: None  ____________________________________________   INITIAL IMPRESSION / ASSESSMENT AND PLAN / ED COURSE  Pertinent labs & imaging results that were available during my care of the patient were reviewed by me and considered in my medical decision making (see chart for details).  Patient here for evaluation of 2 things, the principal thing she is here for is a check  of her dates, we did do this, she is approximately 18 weeks by ultrasound.  We will let her know about that.  There is no evidence at this time of an acute gallbladder attack and she is been evaluated for that very recently.  She does sometimes smoke marijuana and I have advised her to quit smoking during this pregnancy she knows what to do and she is surprised by this pregnancy.  Her abdomen is completely benign there is no evidence of pregnancy or gallbladder related complaint today.  She has not been having any vaginal bleeding or discharge.  However, as it appears that she has been having spotting off and on during the course of this pregnancy I will check an Rh status as I cannot find that in the records.  If she is Rh- I will talk to [facility] about whether they wish me to give her a gram which I suspect they will    ____________________________________________   FINAL CLINICAL IMPRESSION(S) / ED DIAGNOSES  Final diagnoses:  None      This chart was dictated using voice recognition software.  Despite best efforts to proofread,  errors can occur which can change meaning.      Jeanmarie Plant, MD 07/09/18 2102

## 2018-07-16 ENCOUNTER — Encounter: Payer: Self-pay | Admitting: Surgery

## 2018-11-09 ENCOUNTER — Observation Stay
Admission: EM | Admit: 2018-11-09 | Discharge: 2018-11-10 | Disposition: A | Discharge status: Home / Self Care | Payer: Medicaid Other | Attending: Obstetrics & Gynecology | Admitting: Obstetrics & Gynecology

## 2018-11-09 ENCOUNTER — Observation Stay: Payer: Medicaid Other

## 2018-11-09 ENCOUNTER — Other Ambulatory Visit: Payer: Self-pay

## 2018-11-09 DIAGNOSIS — O34219 Maternal care for unspecified type scar from previous cesarean delivery: Secondary | ICD-10-CM

## 2018-11-09 DIAGNOSIS — K8020 Calculus of gallbladder without cholecystitis without obstruction: Secondary | ICD-10-CM | POA: Insufficient documentation

## 2018-11-09 DIAGNOSIS — O368130 Decreased fetal movements, third trimester, not applicable or unspecified: Secondary | ICD-10-CM | POA: Diagnosis not present

## 2018-11-09 DIAGNOSIS — O26893 Other specified pregnancy related conditions, third trimester: Secondary | ICD-10-CM

## 2018-11-09 DIAGNOSIS — O368390 Maternal care for abnormalities of the fetal heart rate or rhythm, unspecified trimester, not applicable or unspecified: Secondary | ICD-10-CM

## 2018-11-09 DIAGNOSIS — R1011 Right upper quadrant pain: Secondary | ICD-10-CM | POA: Insufficient documentation

## 2018-11-09 DIAGNOSIS — Z79899 Other long term (current) drug therapy: Secondary | ICD-10-CM | POA: Diagnosis not present

## 2018-11-09 DIAGNOSIS — R1031 Right lower quadrant pain: Secondary | ICD-10-CM

## 2018-11-09 DIAGNOSIS — R102 Pelvic and perineal pain: Secondary | ICD-10-CM | POA: Insufficient documentation

## 2018-11-09 DIAGNOSIS — Z3A35 35 weeks gestation of pregnancy: Secondary | ICD-10-CM

## 2018-11-09 DIAGNOSIS — R109 Unspecified abdominal pain: Secondary | ICD-10-CM

## 2018-11-09 DIAGNOSIS — O0990 Supervision of high risk pregnancy, unspecified, unspecified trimester: Secondary | ICD-10-CM

## 2018-11-09 DIAGNOSIS — O26899 Other specified pregnancy related conditions, unspecified trimester: Secondary | ICD-10-CM

## 2018-11-09 HISTORY — DX: Calculus of gallbladder and bile duct with acute cholecystitis with obstruction: K80.63

## 2018-11-09 LAB — COMPREHENSIVE METABOLIC PANEL
ALK PHOS: 90 U/L (ref 38–126)
ALT: 8 U/L (ref 0–44)
AST: 12 U/L — AB (ref 15–41)
Albumin: 2.8 g/dL — ABNORMAL LOW (ref 3.5–5.0)
Anion gap: 5 (ref 5–15)
BILIRUBIN TOTAL: 0.7 mg/dL (ref 0.3–1.2)
BUN: 6 mg/dL (ref 6–20)
CALCIUM: 8.2 mg/dL — AB (ref 8.9–10.3)
CO2: 25 mmol/L (ref 22–32)
CREATININE: 0.6 mg/dL (ref 0.44–1.00)
Chloride: 104 mmol/L (ref 98–111)
GFR calc Af Amer: >60 mL/min (ref >60)
Glucose, Bld: 106 mg/dL — ABNORMAL HIGH (ref 70–99)
Potassium: 3.2 mmol/L — ABNORMAL LOW (ref 3.5–5.1)
Sodium: 134 mmol/L — ABNORMAL LOW (ref 135–145)
TOTAL PROTEIN: 6.7 g/dL (ref 6.5–8.1)

## 2018-11-09 LAB — CBC
HEMATOCRIT: 29.8 % — AB (ref 36.0–46.0)
HEMOGLOBIN: 9.6 g/dL — AB (ref 12.0–15.0)
MCH: 26.9 pg (ref 26.0–34.0)
MCHC: 32.2 g/dL (ref 30.0–36.0)
MCV: 83.5 fL (ref 80.0–100.0)
NRBC: 0 % (ref 0.0–0.2)
Platelets: 201 10*3/uL (ref 150–400)
RBC: 3.57 MIL/uL — ABNORMAL LOW (ref 3.87–5.11)
RDW: 13.4 % (ref 11.5–15.5)
WBC: 12.4 10*3/uL — ABNORMAL HIGH (ref 4.0–10.5)

## 2018-11-09 MED ORDER — MORPHINE SULFATE (PF) 10 MG/ML IV SOLN
10.0000 mg | INTRAVENOUS | Status: DC | PRN
  Administered 2018-11-09 (×2): 10 mg via INTRAMUSCULAR
  Filled 2018-11-09 (×2): qty 1

## 2018-11-09 MED ORDER — ACETAMINOPHEN 325 MG PO TABS
650.0000 mg | ORAL_TABLET | ORAL | Status: DC | PRN
  Administered 2018-11-09: 650 mg via ORAL
  Filled 2018-11-09: qty 2

## 2018-11-09 NOTE — H&P (Signed)
Obstetrics Admission History & Physical   CC: RUQ PAIN  HPI:  28 y.o. [id number] @ [redacted]w[redacted]d (12/10/2018, by Last Menstrual Period). Admitted on 11/09/2018:   Patient Active Problem List   Diagnosis Date Noted  . RUQ pain 11/09/2018     Presents for RUQ pain that began today and has been severe.  No nausea, vomiting, diarrhea, hot flashes, fever, chills, other.  Decreased fetal movements, she is [redacted] weeks pregnant.   Prenatal care at: at [facility]. Pregnancy complicated by known history of Gall Stones dx at 18 weeks, no meds or treatment.  Had been fine up until today..  ROS: A review of systems was performed and negative, except as stated in the above HPI. PMHx:  Past Medical History:  Diagnosis Date  . Gallbladder & bile duct stone, acute cholecystitis and obstruction    PSHx:  Past Surgical History:  Procedure Laterality Date  . CESAREAN SECTION     Medications:  Medications Prior to Admission  Medication Sig Dispense Refill Last Dose  . dicyclomine (BENTYL) 10 MG capsule Take 1 capsule (10 mg total) by mouth 3 (three) times daily as needed for up to 5 days (Abdominal cramps). 15 capsule 0    Allergies: has No Known Allergies. OBHx:  OB History  Gravida Para Term Preterm AB Living  4 2 0   1 2  SAB TAB Ectopic Multiple Live Births               # Outcome Date GA Lbr Len/2nd Weight Sex Delivery Anes PTL Lv  4 Current           3 Para           2 Para           1 AB            JGG:[id number] except as detailed in HPI.Marland Kitchen  No family history of birth defects. Soc Hx: Alcohol: none and Recreational drug use: none  Objective:   Vitals:   11/09/18 1549  BP: 105/67  Pulse: (!) 129  Resp: 16  Temp: 99.9 F (37.7 C)   Constitutional: Well nourished, well developed female in no acute distress.  HEENT: normal Skin: Warm and dry.  Cardiovascular:Regular rate and rhythm.   Extremity: trace to 1+ bilateral pedal edema Respiratory: Clear to auscultation bilateral. Normal  respiratory effort Abdomen: gravid ND.  RUQ T mild.  POS MURPHYS.  No guarding, rebound.    FHT 140s Back: no CVAT Neuro: DTRs 2+, Cranial nerves grossly intact Psych: Alert and Oriented x3. No memory deficits. Normal mood and affect.  MS: normal gait, normal bilateral lower extremity ROM/strength/stability.  Assessment & Plan:   28 y.o. [id number] @ [redacted]w[redacted]d, Admitted on 11/09/2018:RUQ pain w h/o gall stones   Labs, [place]   Counseled that due to her sx's mild (no nausea, vomiting, dehydration, and not apparantly in severe pain) would likely not treat gall stones more than pain meds and adequate hydration/nutrition.  Surgery would have to be post partum, and only would induce labor in severe pain or sx cases.  Annamarie Major, MD, [facility], [facility] 11/09/2018  4:56 PM

## 2018-11-10 ENCOUNTER — Observation Stay: Payer: Medicaid Other

## 2018-11-10 ENCOUNTER — Encounter: Payer: Self-pay | Admitting: Certified Nurse Midwife

## 2018-11-10 DIAGNOSIS — O26893 Other specified pregnancy related conditions, third trimester: Secondary | ICD-10-CM | POA: Diagnosis not present

## 2018-11-10 DIAGNOSIS — O0990 Supervision of high risk pregnancy, unspecified, unspecified trimester: Secondary | ICD-10-CM

## 2018-11-10 DIAGNOSIS — O368130 Decreased fetal movements, third trimester, not applicable or unspecified: Secondary | ICD-10-CM | POA: Diagnosis not present

## 2018-11-10 DIAGNOSIS — R1011 Right upper quadrant pain: Secondary | ICD-10-CM | POA: Diagnosis not present

## 2018-11-10 DIAGNOSIS — O34219 Maternal care for unspecified type scar from previous cesarean delivery: Secondary | ICD-10-CM

## 2018-11-10 DIAGNOSIS — Z3A35 35 weeks gestation of pregnancy: Secondary | ICD-10-CM | POA: Diagnosis not present

## 2018-11-10 LAB — CBC
HCT: 28.6 % — ABNORMAL LOW (ref 36.0–46.0)
Hemoglobin: 9.2 g/dL — ABNORMAL LOW (ref 12.0–15.0)
MCH: 26.7 pg (ref 26.0–34.0)
MCHC: 32.2 g/dL (ref 30.0–36.0)
MCV: 82.9 fL (ref 80.0–100.0)
Platelets: 214 10*3/uL (ref 150–400)
RBC: 3.45 MIL/uL — ABNORMAL LOW (ref 3.87–5.11)
RDW: 13.7 % (ref 11.5–15.5)
WBC: 12.2 10*3/uL — ABNORMAL HIGH (ref 4.0–10.5)
nRBC: 0 % (ref 0.0–0.2)

## 2018-11-10 LAB — LIPASE, BLOOD: LIPASE: 18 U/L (ref 11–51)

## 2018-11-10 LAB — POTASSIUM: Potassium: 3.5 mmol/L (ref 3.5–5.1)

## 2018-11-10 MED ORDER — BUTORPHANOL TARTRATE 2 MG/ML IJ SOLN: 1.0000 mg | INTRAMUSCULAR | Status: DC | PRN

## 2018-11-10 MED ORDER — ACETAMINOPHEN 325 MG PO TABS: 650.0000 mg | ORAL_TABLET | ORAL | Status: DC | PRN

## 2018-11-10 MED ORDER — LACTATED RINGERS IV BOLUS
250.0000 mL | Freq: Once | INTRAVENOUS | Status: AC
  Administered 2018-11-10: 250 mL via INTRAVENOUS

## 2018-11-10 MED ORDER — HYDROCODONE-ACETAMINOPHEN 5-325 MG PO TABS
2.0000 | ORAL_TABLET | Freq: Once | ORAL | Status: AC
  Administered 2018-11-10: 2 via ORAL

## 2018-11-10 MED ORDER — KCL-LACTATED RINGERS 20 MEQ/L IV SOLN
INTRAVENOUS | Status: DC
  Filled 2018-11-10 (×2): qty 1000

## 2018-11-10 MED ORDER — HYDROCODONE-ACETAMINOPHEN 5-325 MG PO TABS
ORAL_TABLET | ORAL | Status: AC
  Filled 2018-11-10: qty 2

## 2018-11-10 MED ORDER — CYCLOBENZAPRINE HCL 10 MG PO TABS
5.0000 mg | ORAL_TABLET | Freq: Once | ORAL | Status: AC
  Administered 2018-11-10: 5 mg via ORAL
  Filled 2018-11-10: qty 1

## 2018-11-10 MED ORDER — POTASSIUM CHLORIDE 2 MEQ/ML IV SOLN
INTRAVENOUS | Status: DC
  Administered 2018-11-10 (×2): via INTRAVENOUS
  Filled 2018-11-10 (×5): qty 1000

## 2018-11-10 NOTE — Final Progress Note (Signed)
Physician Final Progress Note  Patient ID: Susan Oneal MRN: [id number] DOB/AGE: 07-Nov-1990 28 y.o.  Admit date: 11/09/2018 Admitting provider: Nadara Mustard, MD Discharge date: 11/10/2018   Admission Diagnoses: IUP at 35wk 6 days RUQ pain RLQ pain Known gallstones   Discharge Diagnoses:  IUP at 35wk 6 days RUQ pain RLQ pain-due to round ligament pain Cholelithiasis without cholecystitis or obstruction Normal appendix on MRI Fetal heart rate decelerations-resolved    Consults: None  Significant Findings/ Diagnostic Studies: 28 y.o. [id number] @ [redacted]w[redacted]d (12/09/2018, by 18wk1d BPD on 07/09/2018) Admitted on 11/09/2018 with RUQ pain and RLQ pain that started 11/09/2018. Pain worsened with moving her right leg and when sitting up. No nausea, vomiting, diarrhea, hot flashes, fever, chills, on admission. Did have some anorexia on day of admission-only ate applesauce. States had decreased fetal movements on admission.  Prenatal care at: at [facility]. Pregnancy complicated by late entry to care, prior Cesarean section x 2,  and  known history of Gall Stones dx at 18 weeks. No meds or treatment.  Had been fine up until 11/09/2018. Desires delivery at [facility] and is thinking about having a TOLAC.  After admission, a RUQ ultrasound was done which confirmed gallstones, but there was no evidence of acute cholecystitis or obstruction of the CBD. Liver function tests and a lipase were normal. WBC was 12,400. There was a mild anemia with H&H 9.6gm/dl and 21.9%. She received 2 doses of morphine 10 mgm IM at 2018 and 2332 which brought her pain level down from a 10 to a 6. She also received a dose of Flexeril for the RLQ pain which was thought to be due to round ligament pain.  Her fetal heart tracing was reactive on arrival with much fetal movement. After the morphine she had several isolated variable decelerations (questionable whether it was maternal heart rate) and the variability was at time minimal. Once the  medication had worn off the baby had a nicely reactive tracing with accelerations and moderate variability. A BPP was 8/10 (2 off for breathing-but was done after patient had Flexeril).   She was given a low fat diet for lunch and had worsening RUQ pain, especially when she flexed her hip and it was accompanied by nausea. A repeat WBC was 12,200. An MRI of the abdomen and pelvis to R/O appendicitis was negative except for the gallstones.   She was tolerating liquids, the pain was at a 6/10 with her movement, but she appeared to be comfortable resting. Patient discharged home with a note to stay out of work until she has a follow up appointment at the [facility] on 2/14. I did call the [facility] and asked them to do a NST on 2/14 and to get her an appointment with [facility] to discuss delivery timing and method as well as having a cholecystectomy.     Procedures: RUQ limited ultrasound BPP MRI of abdomen and pelvis.  Discharge Condition: stable  Disposition: Discharge disposition: 01-Home or Self Care       Diet: Low fat diet  Discharge Activity: activity as tolerated  Discharge Instructions    Discharge patient   Complete by:  As directed    Discharge disposition:  01-Home or Self Care   Discharge patient date:  11/10/2018     Allergies as of 11/10/2018   No Known Allergies     Medication List    TAKE these medications   acetaminophen 325 MG tablet Commonly known as:  TYLENOL Take 2 tablets (650  mg total) by mouth every 4 (four) hours as needed for mild pain or moderate pain (for pain scale < 4ORtemperature>/=100.5 F).   ferrous sulfate 325 (65 FE) MG tablet Take 325 mg by mouth daily with breakfast.   multivitamin-prenatal 27-0.8 MG Tabs tablet Take 1 tablet by mouth daily at 12 noon.        Total time spent taking care of this patient: 40 minutes  Signed: Farrel Conners 11/10/2018, 4:17 PM

## 2018-11-10 NOTE — Progress Notes (Signed)
Pt informed of plan of care, including IV fluid bolus and maintenance fluids. Pt states she is unsure if she will be able to stay, despite wanting to follow new plan of care because her Aunt, who is watching her 26yo son, has to be at work at [employer]. She is trying to formulate a plan for her son and will then decide if she can stay and proceed with treatment as described or if she will need to leave AMA.

## 2018-11-10 NOTE — Discharge Instructions (Signed)
Gallbladder Eating Plan If you have a gallbladder condition, you may have trouble digesting fats. Eating a low-fat diet can help reduce your symptoms, and may be helpful before and after having surgery to remove your gallbladder (cholecystectomy). Your health care provider may recommend that you work with a diet and nutrition specialist (dietitian) to help you reduce the amount of fat in your diet. What are tips for following this plan? General guidelines  Limit your fat intake to less than 30% of your total daily calories. If you eat around 1,800 calories each day, this is less than 60 grams (g) of fat per day.  Fat is an important part of a healthy diet. Eating a low-fat diet can make it hard to maintain a healthy body weight. Ask your dietitian how much fat, calories, and other nutrients you need each day.  Eat small, frequent meals throughout the day instead of three large meals.  Drink at least 8-10 cups of fluid a day. Drink enough fluid to keep your urine clear or pale yellow.  Limit alcohol intake to no more than 1 drink a day for nonpregnant women and 2 drinks a day for men. One drink equals 12 oz of beer, 5 oz of wine, or 1 oz of hard liquor. Reading food labels  Check Nutrition Facts on food labels for the amount of fat per serving. Choose foods with less than 3 grams of fat per serving. Shopping  Choose nonfat and low-fat healthy foods. Look for the words "nonfat," "low fat," or "fat free."  Avoid buying processed or prepackaged foods. Cooking  Cook using low-fat methods, such as baking, broiling, grilling, or boiling.  Cook with small amounts of healthy fats, such as olive oil, grapeseed oil, canola oil, or sunflower oil. What foods are recommended?   All fresh, frozen, or canned fruits and vegetables.  Whole grains.  Low-fat or non-fat (skim) milk and yogurt.  Lean meat, skinless poultry, fish, eggs, and beans.  Low-fat protein supplement powders or  drinks.  Spices and herbs. What foods are not recommended?  High-fat foods. These include baked goods, fast food, fatty cuts of meat, ice cream, french toast, sweet rolls, pizza, cheese bread, foods covered with butter, creamy sauces, or cheese.  Fried foods. These include french fries, tempura, battered fish, breaded chicken, fried breads, and sweets.  Foods with strong odors.  Foods that cause bloating and gas. Summary  A low-fat diet can be helpful if you have a gallbladder condition, or before and after gallbladder surgery.  Limit your fat intake to less than 30% of your total daily calories. This is about 60 g of fat if you eat 1,800 calories each day.  Eat small, frequent meals throughout the day instead of three large meals. This information is not intended to replace advice given to you by your health care provider. Make sure you discuss any questions you have with your health care provider. Document Released: 09/20/2013 Document Revised: 10/23/2016 Document Reviewed: 10/23/2016 Elsevier Interactive Patient Education  2019 Elsevier Inc.  

## 2019-03-03 ENCOUNTER — Encounter (HOSPITAL_COMMUNITY): Payer: Self-pay

## 2019-04-26 ENCOUNTER — Telehealth: Payer: Self-pay

## 2019-04-26 NOTE — Telephone Encounter (Signed)
Referral and pap faxed to [facility] Dysplasia clinic ...Marland KitchenMarland KitchenDebera Lat, RN  March 22, 2019 11:42 AM per [employer].  TC with patient. States she didn't go to recent [facility] app because her cycle was on, and reports she rescheduled appt but can't remember when it is. Will call RN back. Per [facility] no pending appts scheduled. Aileen Fass, RN

## 2019-06-28 ENCOUNTER — Telehealth: Payer: Self-pay

## 2019-06-28 NOTE — Telephone Encounter (Signed)
TC with patient.  Explained that she still doesn't have appt for colpo and that RN can see where she completed her previsit with [facility] but cancelled appt.  Patient states will call to make appt.  [facility] # given to patient.Aileen Fass, RN

## 2019-07-05 NOTE — Telephone Encounter (Signed)
Closed to pap f/u Susan Newsome, RN  

## 2019-07-12 ENCOUNTER — Encounter: Payer: Self-pay | Admitting: Surgery

## 2019-07-12 ENCOUNTER — Ambulatory Visit (INDEPENDENT_AMBULATORY_CARE_PROVIDER_SITE_OTHER): Payer: Medicaid Other | Admitting: Surgery

## 2019-07-12 ENCOUNTER — Other Ambulatory Visit: Payer: Self-pay

## 2019-07-12 VITALS — BP 124/83 | HR 72 | Temp 97.7°F | Ht 63.0 in | Wt 150.0 lb

## 2019-07-12 DIAGNOSIS — K8020 Calculus of gallbladder without cholecystitis without obstruction: Secondary | ICD-10-CM | POA: Diagnosis not present

## 2019-07-12 NOTE — Progress Notes (Signed)
07/12/2019  Reason for Visit:  Symptomatic cholelithiasis  Referring Provider:  Daniel Nones, MD  History of Present Illness: Susan Oneal is a 28 y.o. female presenting for evaluation of symptomatic cholelithiasis.  She was diagnosed with cholelithiasis during her second pregnancy and has had intermittent issues since.  On her last pregnancy, she was seen in the hospital in February for the same and discharged home.  She delivered in March and presents for further evaluation.  She reports intermittent episodes of RUQ pain, associated with nausea, vomiting, and pain radiating to the back.  Denies fevers, chills, chest pain, shortness of breath, but reports that during the episodes, she gets anxious and finds it hard to breath.    Past Medical History: Past Medical History:  Diagnosis Date  . Gallbladder & bile duct stone, acute cholecystitis and obstruction      Past Surgical History: Past Surgical History:  Procedure Laterality Date  . CESAREAN SECTION     X 3, 2013, 2015, 2020    Home Medications: Prior to Admission medications   Medication Sig Start Date End Date Taking? Authorizing Provider  acetaminophen (TYLENOL) 325 MG tablet Take 2 tablets (650 mg total) by mouth every 4 (four) hours as needed for mild pain or moderate pain (for pain scale < 4ORtemperature>/=100.5 F). 11/10/18  Yes Farrel Conners, CNM  ferrous sulfate 325 (65 FE) MG tablet Take 325 mg by mouth daily with breakfast.   Yes [provider]  Prenatal Vit-Fe Fumarate-FA (MULTIVITAMIN-PRENATAL) 27-0.8 MG TABS tablet Take 1 tablet by mouth daily at 12 noon.   Yes [provider]  Burr Medico 150-35 MCG/24HR transdermal patch  06/15/19  Yes [provider]    Allergies: No Known Allergies  Social History:  reports that she has never smoked. She has never used smokeless tobacco. She reports that she does not drink alcohol or use drugs.   Family History: No family history of  biliary issues  Review of Systems: Review of Systems  Constitutional: Negative for chills and fever.  HENT: Negative for hearing loss.   Respiratory: Negative for shortness of breath.   Cardiovascular: Negative for chest pain.  Gastrointestinal: Positive for abdominal pain, nausea and vomiting.  Genitourinary: Negative for dysuria.  Musculoskeletal: Negative for myalgias.  Skin: Negative for rash.  Neurological: Negative for dizziness.  Psychiatric/Behavioral: Negative for depression.    Physical Exam BP 124/83   Pulse 72   Temp 97.7 F (36.5 C)   Ht 5\' 3"  (1.6 m)   Wt 150 lb (68 kg)   LMP 07/12/2019 (Exact Date)   SpO2 97%   Breastfeeding No   BMI 26.57 kg/m  CONSTITUTIONAL: No acute distress HEENT:  Normocephalic, atraumatic, extraocular motion intact. NECK: Trachea is midline, and there is no jugular venous distension.  RESPIRATORY:  Lungs are clear, and breath sounds are equal bilaterally. Normal respiratory effort without pathologic use of accessory muscles. CARDIOVASCULAR: Heart is regular without murmurs, gallops, or rubs. GI: The abdomen is soft, non-distended, currently non-tender to palpation.  MUSCULOSKELETAL:  Normal muscle strength and tone in all four extremities.  No peripheral edema or cyanosis. SKIN: Skin turgor is normal. There are no pathologic skin lesions.  NEUROLOGIC:  Motor and sensation is grossly normal.  Cranial nerves are grossly intact. PSYCH:  Alert and oriented to person, place and time. Affect is normal.  Laboratory Analysis: No results found for this or any previous visit (from the past 24 hour(s)).  Imaging: MRI abdomen 2/12 IMPRESSION: 1. Cholelithiasis. No  MRI findings of acute cholecystitis. No biliary ductal dilatation. No evidence of choledocholithiasis. 2. Normal appendix.  No evidence of acute bowel pathology. 3. Mild asymmetric fullness of the right renal collecting system, within normal physiologic limits for the third trimester  of pregnancy. 4. Enlarged gravid uterus with single intrauterine gestation in cephalic lie. This MRI examination is not tailored for fetal or uterine evaluation. No evidence of an acute gestational abnormality.   Assessment and Plan: This is a 28 y.o. female with symptomatic cholelithiasis.  Discussed with the patient that given her symptoms, we can offer cholecystectomy.  Discussed the role for laparoscopic cholecystectomy, including risks of bleeding, infection, and injury to surrounding structures.  She's willing to proceed.  Will schedule for 10/22.  Will maintain a low fat diet until then.  She understands she will need covid testing prior to surgery.  She will confirm at work that 10/22 works for her, but if not, will call us to reschedule.  Face-to-face time spent with the patient and care providers was 60 minutes, with more than 50% of the time spent counseling, educating, and coordinating care of the patient.     Melvyn Neth, [facility]

## 2019-07-12 NOTE — Patient Instructions (Addendum)
You have requested to have your gallbladder removed. This will be done at [facility] with Dr. Piscoya.  You will most likely be out of work 1-2 weeks for this surgery. You will return after your post-op appointment with a lifting restriction for approximately 4 more weeks.  You will be able to eat anything you would like to following surgery. But, start by eating a bland diet and advance this as tolerated. The Gallbladder diet is below, please go as closely by this diet as possible prior to surgery to avoid any further attacks.  Please see the (blue)pre-care form that you have been given today. If you have any questions, please call our office.  Laparoscopic Cholecystectomy Laparoscopic cholecystectomy is surgery to remove the gallbladder. The gallbladder is located in the upper right part of the abdomen, behind the liver. It is a storage sac for bile, which is produced in the liver. Bile aids in the digestion and absorption of fats. Cholecystectomy is often done for inflammation of the gallbladder (cholecystitis). This condition is usually caused by a buildup of gallstones (cholelithiasis) in the gallbladder. Gallstones can block the flow of bile, and that can result in inflammation and pain. In severe cases, emergency surgery may be required. If emergency surgery is not required, you will have time to prepare for the procedure. Laparoscopic surgery is an alternative to open surgery. Laparoscopic surgery has a shorter recovery time. Your common bile duct may also need to be examined during the procedure. If stones are found in the common bile duct, they may be removed. LET YOUR HEALTH CARE PROVIDER KNOW ABOUT:  Any allergies you have.  All medicines you are taking, including vitamins, herbs, eye drops, creams, and over-the-counter medicines.  Previous problems you or members of your family have had with the use of anesthetics.  Any blood disorders you have.  Previous surgeries you have  had.    Any medical conditions you have. RISKS AND COMPLICATIONS Generally, this is a safe procedure. However, problems may occur, including:  Infection.  Bleeding.  Allergic reactions to medicines.  Damage to other structures or organs.  A stone remaining in the common bile duct.  A bile leak from the cyst duct that is clipped when your gallbladder is removed.  The need to convert to open surgery, which requires a larger incision in the abdomen. This may be necessary if your surgeon thinks that it is not safe to continue with a laparoscopic procedure. BEFORE THE PROCEDURE  Ask your health care provider about:  Changing or stopping your regular medicines. This is especially important if you are taking diabetes medicines or blood thinners.  Taking medicines such as aspirin and ibuprofen. These medicines can thin your blood. Do not take these medicines before your procedure if your health care provider instructs you not to.  Follow instructions from your health care provider about eating or drinking restrictions.  Let your health care provider know if you develop a cold or an infection before surgery.  Plan to have someone take you home after the procedure.  Ask your health care provider how your surgical site will be marked or identified.  You may be given antibiotic medicine to help prevent infection. PROCEDURE  To reduce your risk of infection:  Your health care team will wash or sanitize their hands.  Your skin will be washed with soap.  An IV tube may be inserted into one of your veins.  You will be given a medicine to make you fall   asleep (general anesthetic). °· A breathing tube will be placed in your mouth. °· The surgeon will make several small cuts (incisions) in your abdomen. °· A thin, lighted tube (laparoscope) that has a tiny camera on the end will be inserted through one of the small incisions. The camera on the laparoscope will send a picture to a TV  screen (monitor) in the operating room. This will give the surgeon a good view inside your abdomen. °· A gas will be pumped into your abdomen. This will expand your abdomen to give the surgeon more room to perform the surgery. °· Other tools that are needed for the procedure will be inserted through the other incisions. The gallbladder will be removed through one of the incisions. °· After your gallbladder has been removed, the incisions will be closed with stitches (sutures), staples, or skin glue. °· Your incisions may be covered with a bandage (dressing). °The procedure may vary among health care providers and hospitals. °AFTER THE PROCEDURE °· Your blood pressure, heart rate, breathing rate, and blood oxygen level will be monitored often until the medicines you were given have worn off. °· You will be given medicines as needed to control your pain. °  °This information is not intended to replace advice given to you by your health care provider. Make sure you discuss any questions you have with your health care provider. °  °Document Released: 09/15/2005 Document Revised: 06/06/2015 Document Reviewed: 04/27/2013 °Elsevier Interactive Patient Education ©2016 Elsevier Inc. ° ° °Low-Fat Diet for Gallbladder Conditions °A low-fat diet can be helpful if you have pancreatitis or a gallbladder condition. With these conditions, your pancreas and gallbladder have trouble digesting fats. A healthy eating plan with less fat will help rest your pancreas and gallbladder and reduce your symptoms. °WHAT DO I NEED TO KNOW ABOUT THIS DIET? °· Eat a low-fat diet. °¨ Reduce your fat intake to less than 20-30% of your total daily calories. This is less than 50-60 g of fat per day. °¨ Remember that you need some fat in your diet. Ask your dietician what your daily goal should be. °¨ Choose nonfat and low-fat healthy foods. Look for the words "nonfat," "low fat," or "fat free." °¨ As a guide, look on the label and choose foods with  less than 3 g of fat per serving. Eat only one serving. °· Avoid alcohol. °· Do not smoke. If you need help quitting, talk with your health care provider. °· Eat small frequent meals instead of three large heavy meals. °WHAT FOODS CAN I EAT? °Grains °Include healthy grains and starches such as potatoes, wheat bread, fiber-rich cereal, and brown rice. Choose whole grain options whenever possible. In adults, whole grains should account for 45-65% of your daily calories.  °Fruits and Vegetables °Eat plenty of fruits and vegetables. Fresh fruits and vegetables add fiber to your diet. °Meats and Other Protein Sources °Eat lean meat such as chicken and pork. Trim any fat off of meat before cooking it. Eggs, fish, and beans are other sources of protein. In adults, these foods should account for 10-35% of your daily calories. °Dairy °Choose low-fat milk and dairy options. Dairy includes fat and protein, as well as calcium.  °Fats and Oils °Limit high-fat foods such as fried foods, sweets, baked goods, sugary drinks.  °Other °Creamy sauces and condiments, such as mayonnaise, can add extra fat. Think about whether or not you need to use them, or use smaller amounts or low fat options. °WHAT FOODS   ARE NOT RECOMMENDED? °· High fat foods, such as: °¨ Baked goods. °¨ Ice cream. °¨ French toast. °¨ Sweet rolls. °¨ Pizza. °¨ Cheese bread. °¨ Foods covered with batter, butter, creamy sauces, or cheese. °¨ Fried foods. °¨ Sugary drinks and desserts. °· Foods that cause gas or bloating °  °This information is not intended to replace advice given to you by your health care provider. Make sure you discuss any questions you have with your health care provider. °  °Document Released: 09/20/2013 Document Reviewed: 09/20/2013 °Elsevier Interactive Patient Education ©2016 Elsevier Inc. ° ° °

## 2019-07-13 ENCOUNTER — Telehealth: Payer: Self-pay | Admitting: Surgery

## 2019-07-13 NOTE — Telephone Encounter (Signed)
I have called patient to discuss information below. Not able to leave a message on VM, VM is full. I have called the secondary contact-Mother-left a message with her to have patient call office.   Surgery Date: 07/21/19 with Dr Audry Riles cholecystectomy and umbilical hernia repair.  Preadmission Testing Date: 07/15/19 between 1-4:00pm-phone interview.  Covid Testing Date: 07/18/19 between 8-10:30am - patient advised to go to the [facility]  [employer] Video sent via [employer] [facility] and [employer].  Please make pt aware to call [phone], between 1-3:00pm the day before surgery, to find out what time to arrive.

## 2019-07-15 ENCOUNTER — Encounter: Admission: RE | Admit: 2019-07-15 | Payer: Medicaid Other | Source: Ambulatory Visit

## 2019-07-18 ENCOUNTER — Other Ambulatory Visit
Admission: RE | Admit: 2019-07-18 | Discharge: 2019-07-18 | Disposition: A | Discharge status: Home / Self Care | Payer: Medicaid Other | Source: Ambulatory Visit | Attending: Surgery | Admitting: Surgery

## 2019-07-19 ENCOUNTER — Encounter: Admission: RE | Admit: 2019-07-19 | Payer: Medicaid Other | Source: Ambulatory Visit

## 2019-07-19 NOTE — Progress Notes (Signed)
Contacted Dr. Mont Dutton office regarding the patient not showing up for covid testing yesterday or today. Surgery is scheduled for 10/22. Tried calling patient yesterday afternoon but voice mail is full.

## 2019-07-19 NOTE — Pre-Procedure Instructions (Signed)
Notified office, unable to reach patient to do pre-op interview.  The office also unable to reach patient.

## 2019-07-21 ENCOUNTER — Ambulatory Visit: Admission: RE | Admit: 2019-07-21 | Payer: Medicaid Other | Source: Home / Self Care | Admitting: Surgery

## 2019-07-21 ENCOUNTER — Encounter: Admission: RE | Payer: Self-pay | Source: Home / Self Care

## 2019-07-21 SURGERY — LAPAROSCOPIC CHOLECYSTECTOMY
Anesthesia: General

## 2019-07-22 IMAGING — US LIMITED OBSTETRIC ULTRASOUND
2 series · 14 of 26 positions shown · non-contrast
Comparison: none

CLINICAL DATA: Abdominal pain for 1 day. Current assigned
gestational age of 35 weeks 6 days. Evaluate amniotic fluid and
biophysical profile.

EXAM:
LIMITED OBSTETRIC ULTRASOUND AND BIOPHYSICAL PROFILE

[Series 1: limited obstetric ultrasound · 0.25mm/px · 25 acquisitions, 13 frames shown (1 of 2)]
[im 1/25]
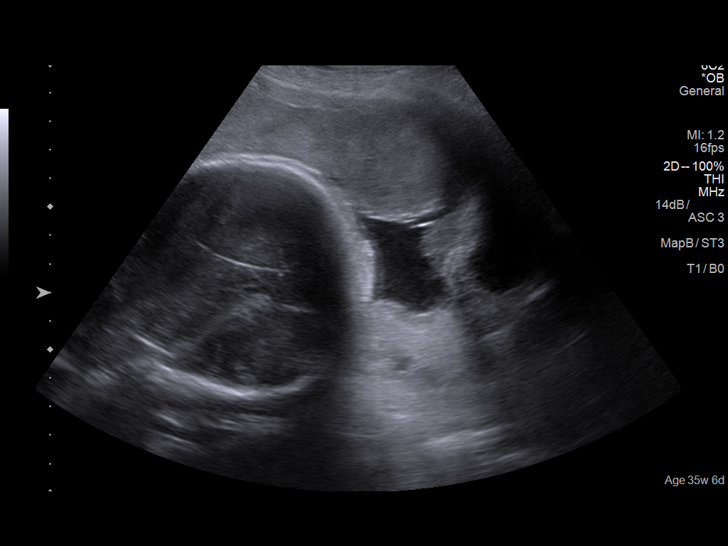
[im 3/25]
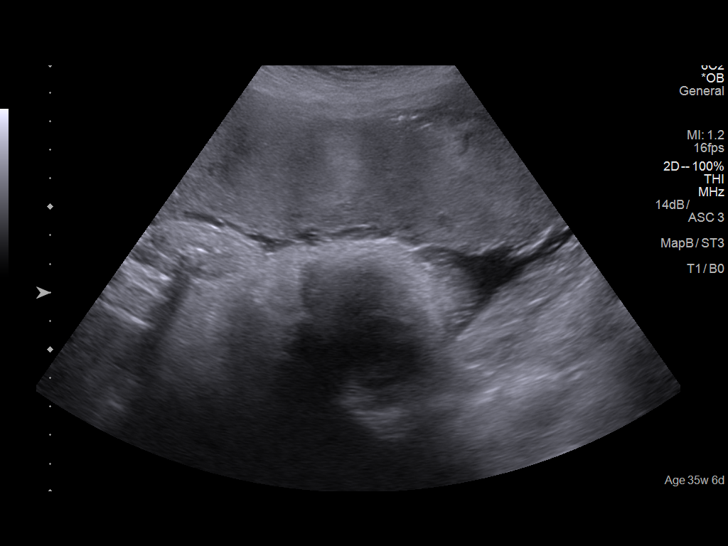
[im 5/25]
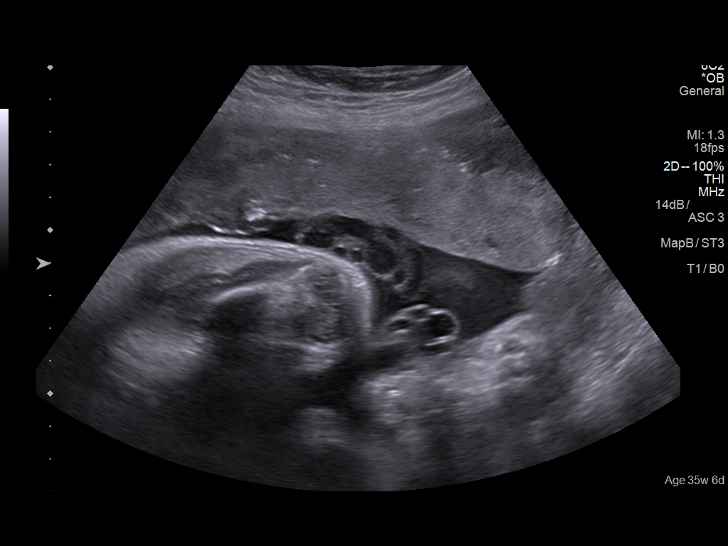
[im 7/25]
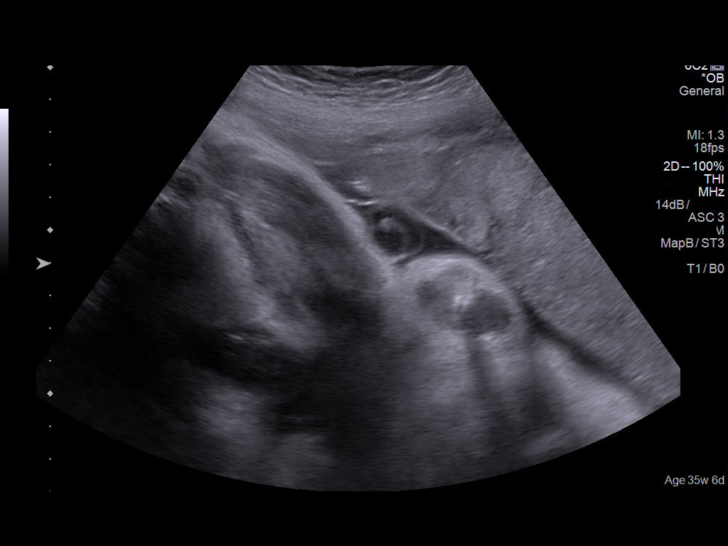
[im 9/25]
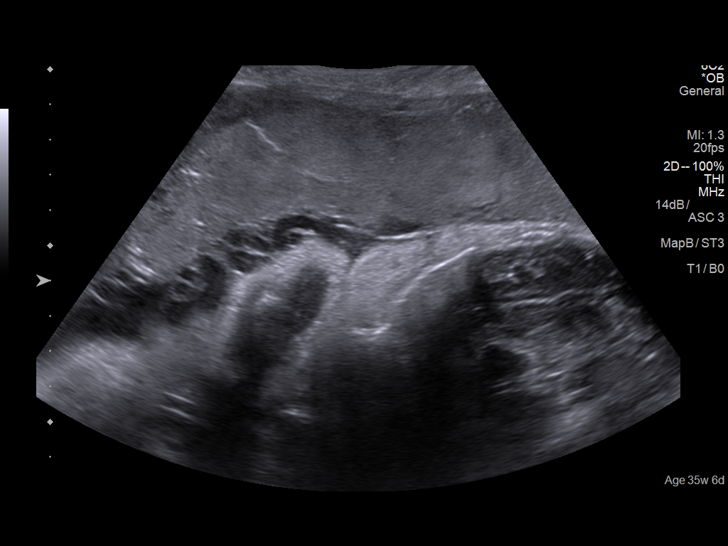
[im 11/25]
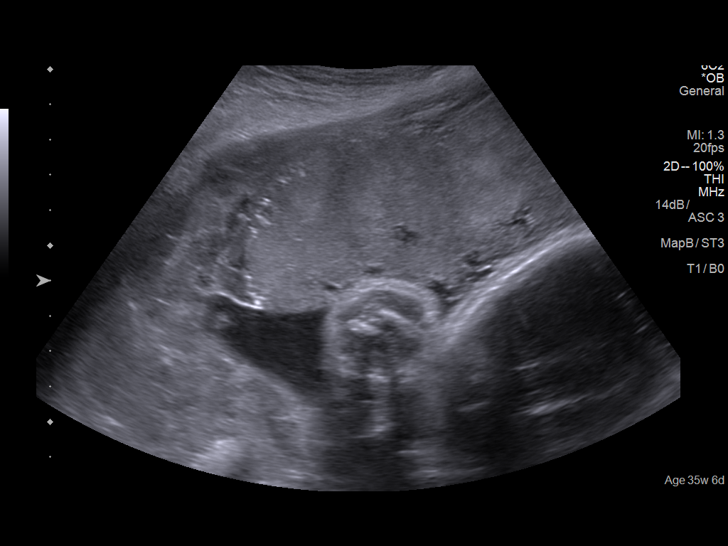
[im 13/25]
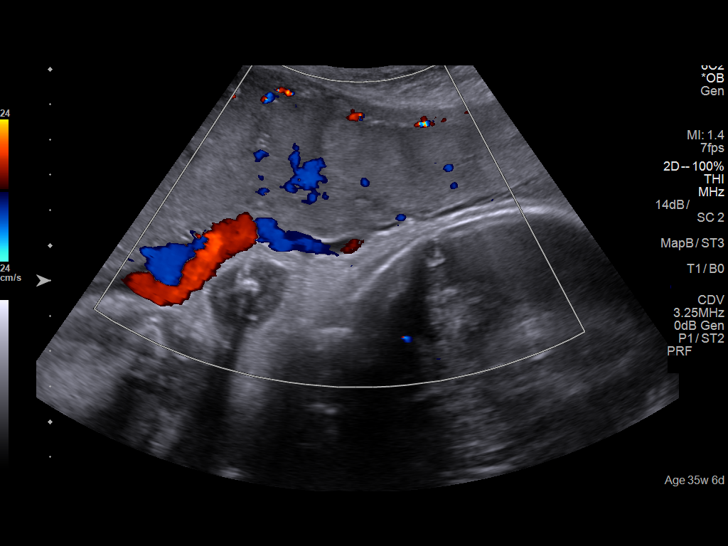
[im 14/25]
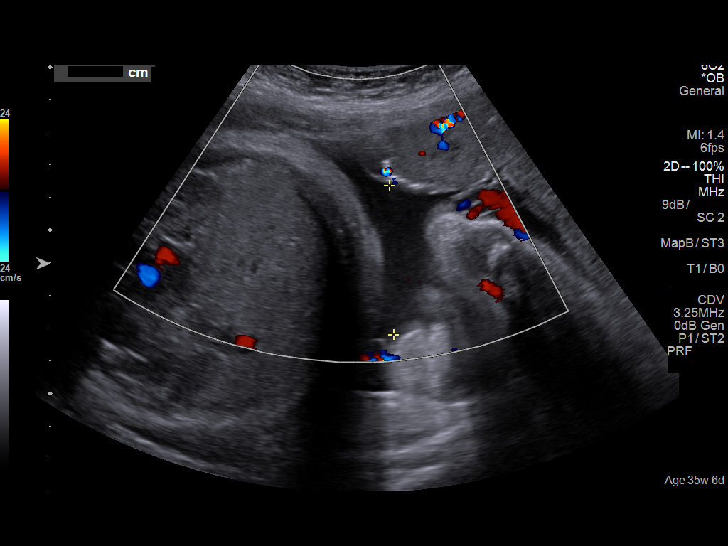
[im 16/25]
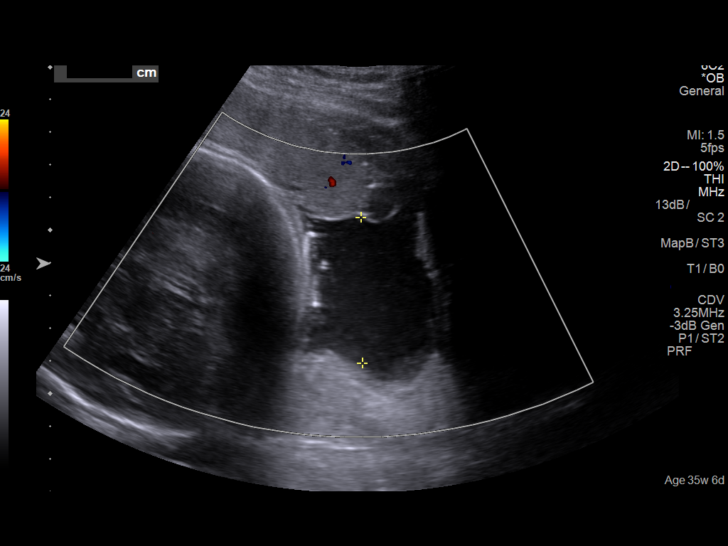
[im 18/25]
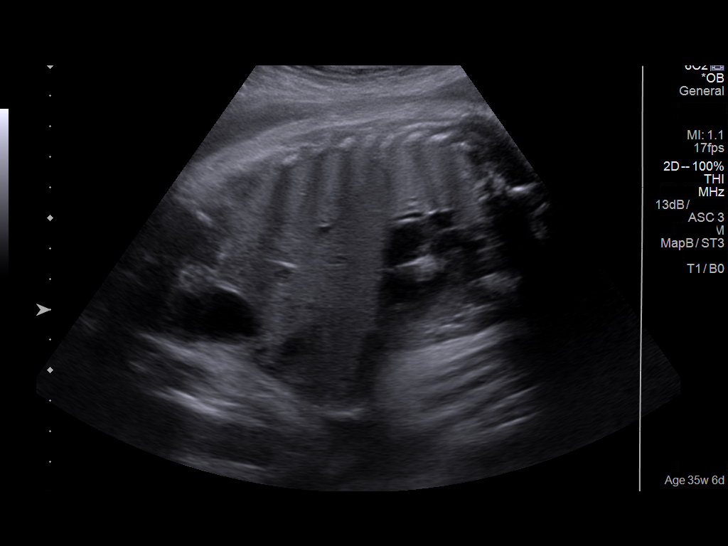
[im 20/25]
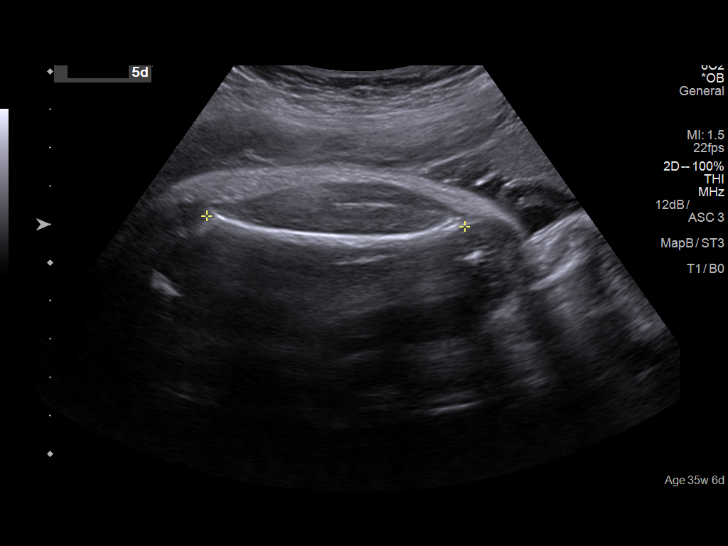
[im 22/25]
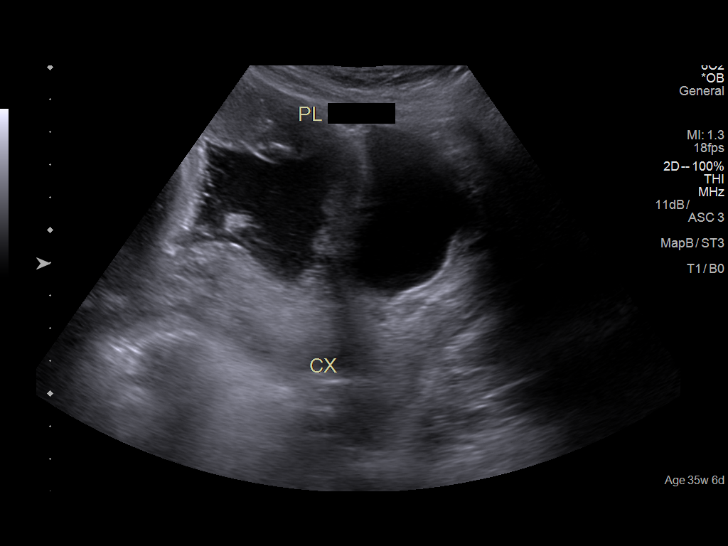
[im 24/25]
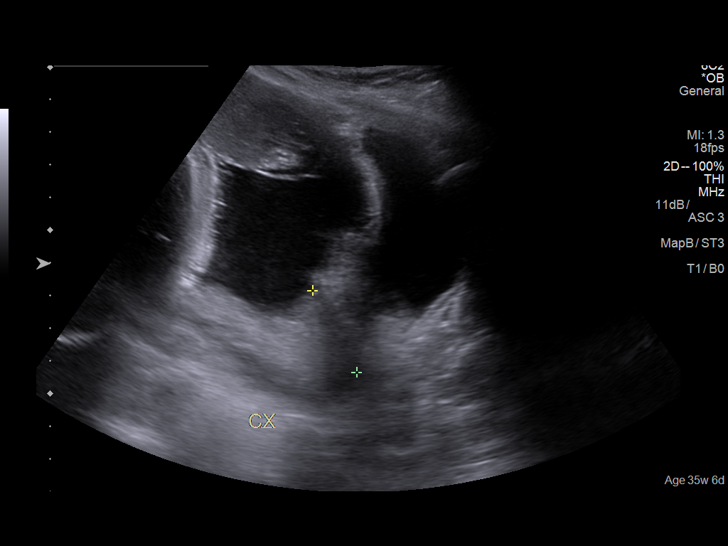

[Series 1001: limited obstetric ultrasound · 1 of 56 frames shown (2 of 2)]
[frame 29/56]
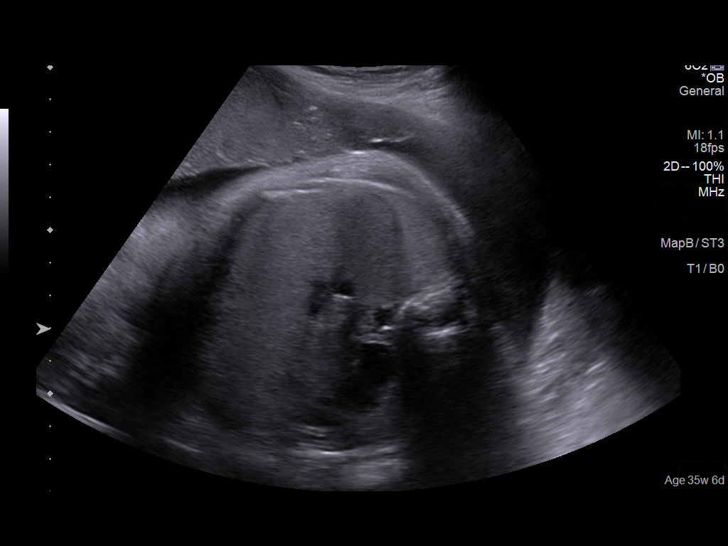

[14 of 26 positions shown; findings below may reference images not displayed]

FINDINGS: Number of Fetuses: 1

Heart Rate:  141 bpm

Movement: Yes

Presentation: Cephalic

Previa: No

Placental Location: Anterior

Amniotic Fluid (Subjective): Within normal limits

AFI 17.1 cm

FL:  6.7cm 34w 4d

Maternal Findings:

Cervix:  2.8 cm transabdominal

Uterus/Adnexae: No abnormality visualized.

BIOPHYSICAL PROFILE

Movement: 2 time: 30 minutes

Breathing: 0

Tone:  2

Amniotic Fluid: 2

Total Score:  6
IMPRESSION: Single living intrauterine fetus in cephalic presentation.

Amniotic fluid volume within normal limits, with AFI of 17.1 cm.

Biophysical profile score of [DATE].

Biophysical profile score is  [DATE].

## 2022-11-28 ENCOUNTER — Emergency Department
Admission: EM | Admit: 2022-11-28 | Discharge: 2022-11-29 | Disposition: A | Discharge status: Home / Self Care | Payer: Medicaid Other | Attending: Emergency Medicine | Admitting: Emergency Medicine

## 2022-11-28 ENCOUNTER — Other Ambulatory Visit: Payer: Self-pay

## 2022-11-28 ENCOUNTER — Encounter: Payer: Self-pay | Admitting: Radiology

## 2022-11-28 DIAGNOSIS — E876 Hypokalemia: Secondary | ICD-10-CM | POA: Insufficient documentation

## 2022-11-28 DIAGNOSIS — Z3493 Encounter for supervision of normal pregnancy, unspecified, third trimester: Secondary | ICD-10-CM

## 2022-11-28 DIAGNOSIS — O99891 Other specified diseases and conditions complicating pregnancy: Secondary | ICD-10-CM | POA: Diagnosis present

## 2022-11-28 LAB — CBC WITH DIFFERENTIAL/PLATELET
Abs Immature Granulocytes: 0.07 10*3/uL (ref 0.00–0.07)
Basophils Absolute: 0 10*3/uL (ref 0.0–0.1)
Basophils Relative: 0 %
Eosinophils Absolute: 0.1 10*3/uL (ref 0.0–0.5)
Eosinophils Relative: 1 %
HCT: 33.5 % — ABNORMAL LOW (ref 36.0–46.0)
Hemoglobin: 10.7 g/dL — ABNORMAL LOW (ref 12.0–15.0)
Immature Granulocytes: 1 %
Lymphocytes Relative: 33 %
Lymphs Abs: 2.1 10*3/uL (ref 0.7–4.0)
MCH: 26.8 pg (ref 26.0–34.0)
MCHC: 31.9 g/dL (ref 30.0–36.0)
MCV: 84 fL (ref 80.0–100.0)
Monocytes Absolute: 0.5 10*3/uL (ref 0.1–1.0)
Monocytes Relative: 8 %
Neutro Abs: 3.7 10*3/uL (ref 1.7–7.7)
Neutrophils Relative %: 57 %
Platelets: 265 10*3/uL (ref 150–400)
RBC: 3.99 MIL/uL (ref 3.87–5.11)
RDW: 13.6 % (ref 11.5–15.5)
WBC: 6.5 10*3/uL (ref 4.0–10.5)
nRBC: 0 % (ref 0.0–0.2)

## 2022-11-28 LAB — POC URINE PREG, ED: Preg Test, Ur: POSITIVE — AB

## 2022-11-28 NOTE — ED Notes (Signed)
Fetal heart tones of 166 found. Fetus visibly kicking the entire time this nurse was assessing FHT.

## 2022-11-28 NOTE — ED Triage Notes (Signed)
Pt states she came in today due to thinking she is pregnant. Pt states she was last sexually active in August of 2023 but has only had one month of missed period. Pt also complains of congestions and ear fullness.

## 2022-11-29 LAB — COMPREHENSIVE METABOLIC PANEL
ALT: 18 U/L (ref 0–44)
AST: 21 U/L (ref 15–41)
Albumin: 3 g/dL — ABNORMAL LOW (ref 3.5–5.0)
Alkaline Phosphatase: 98 U/L (ref 38–126)
Anion gap: 6 (ref 5–15)
BUN: <5 mg/dL — ABNORMAL LOW (ref 6–20)
CO2: 26 mmol/L (ref 22–32)
Calcium: 8.4 mg/dL — ABNORMAL LOW (ref 8.9–10.3)
Chloride: 100 mmol/L (ref 98–111)
Creatinine, Ser: 0.66 mg/dL (ref 0.44–1.00)
GFR, Estimated: >60 mL/min (ref >60)
Glucose, Bld: 127 mg/dL — ABNORMAL HIGH (ref 70–99)
Potassium: 2.8 mmol/L — ABNORMAL LOW (ref 3.5–5.1)
Sodium: 132 mmol/L — ABNORMAL LOW (ref 135–145)
Total Bilirubin: 0.6 mg/dL (ref 0.3–1.2)
Total Protein: 7.6 g/dL (ref 6.5–8.1)

## 2022-11-29 LAB — ABO/RH: ABO/RH(D): O POS

## 2022-11-29 LAB — HCG, QUANTITATIVE, PREGNANCY: hCG, Beta Chain, Quant, S: 27230 m[IU]/mL — ABNORMAL HIGH (ref <5)

## 2022-11-29 MED ORDER — POTASSIUM CHLORIDE CRYS ER 20 MEQ PO TBCR: 20.0000 meq | EXTENDED_RELEASE_TABLET | Freq: Two times a day (BID) | ORAL | 0 refills | Status: DC

## 2022-11-29 NOTE — ED Provider Notes (Signed)
[facility] Provider Note    Event Date/Time   First MD Initiated Contact with Patient 11/28/22 2342     (approximate)   History   Possible Pregnancy   HPI  Susan Oneal is a 32 y.o. female who presents for evaluation for positive pregnancy test.  She denies abdominal pain.  No vaginal bleeding.  Reports last menstrual cycle was in January but was not typical, last sexual activity in August     Physical Exam   Triage Vital Signs: ED Triage Vitals [11/28/22 2333]  Enc Vitals Group     BP 115/74     Pulse Rate 89     Resp 18     Temp 97.6 F (36.4 C)     Temp Source Oral     SpO2 100 %     Weight 88.5 kg (195 lb)     Height 1.626 m ('5\' 4"'$ )     Head Circumference      Peak Flow      Pain Score      Pain Loc      Pain Edu?      Excl. in [place]?     Most recent vital signs: Vitals:   11/28/22 2333  BP: 115/74  Pulse: 89  Resp: 18  Temp: 97.6 F (36.4 C)  SpO2: 100%     General: Awake, no distress.  CV:  Good peripheral perfusion.  Resp:  Normal effort.  Abd:   Gravid appearance, soft, nontender Other:     ED Results / Procedures / Treatments   Labs (all labs ordered are listed, but only abnormal results are displayed) Labs Reviewed  CBC WITH DIFFERENTIAL/PLATELET - Abnormal; Notable for the following components:      Result Value   Hemoglobin 10.7 (*)    HCT 33.5 (*)    All other components within normal limits  COMPREHENSIVE METABOLIC PANEL - Abnormal; Notable for the following components:   Sodium 132 (*)    Potassium 2.8 (*)    Glucose, Bld 127 (*)    BUN <5 (*)    Calcium 8.4 (*)    Albumin 3.0 (*)    All other components within normal limits  POC URINE PREG, ED - Abnormal; Notable for the following components:   Preg Test, Ur Positive (*)    All other components within normal limits  RESP PANEL BY RT-PCR (RSV, FLU A&B, COVID)  RVPGX2  HCG, QUANTITATIVE, PREGNANCY  ABO/RH     EKG     RADIOLOGY EMB U:  BPD measures at approximately 29 weeks    PROCEDURES:  Critical Care performed:   Procedures   MEDICATIONS ORDERED IN ED: Medications - No data to display   IMPRESSION / MDM / [place] / ED COURSE  I reviewed the triage vital signs and the nursing notes. Patient's presentation is most consistent with acute illness / injury with system symptoms.  Patient presents with positive pregnancy test, confirmed in the emergency department.  She is measuring in her third trimester.  She has no physical complaints.  Her vital signs are reassuring.  She has mild chronic hypokalemia, will provide K-Dur, she will follow-up with her OB/GYN      FINAL CLINICAL IMPRESSION(S) / ED DIAGNOSES   Final diagnoses:  Third trimester pregnancy  Hypokalemia     Rx / DC Orders   ED Discharge Orders          Ordered    potassium chloride  SA (KLOR-CON M) 20 MEQ tablet  2 times daily        11/29/22 0013             Note:  This document was prepared using Dragon voice recognition software and may include unintentional dictation errors.   [name], MD 11/29/22 [phone]

## 2022-12-15 DIAGNOSIS — O09899 Supervision of other high risk pregnancies, unspecified trimester: Secondary | ICD-10-CM | POA: Insufficient documentation

## 2023-01-28 DIAGNOSIS — A599 Trichomoniasis, unspecified: Secondary | ICD-10-CM

## 2023-01-28 HISTORY — DX: Trichomoniasis, unspecified: A59.9

## 2023-02-06 NOTE — H&P (Signed)
Susan Oneal is a 32 y.o. female presenting for elective repeat c/s  on 02/10/23 . [facility] 02/13/23 Poor prenatal care  32 y.o. G6P3 LMP sometime in Aug 2023 with ultrasound on 12/19/22 @ [redacted]w[redacted]d .Estimated Date of Delivery:02/13/2023 Sex of baby and name:  " "   Partner:     Factors complicating this pregnancy  Late to Care-  [facility] on 01/29/23 (38w) Obesity BMI: _37.57_  Baseline labs: Early 1 hour GTT: N/a P/C ratio: CMP:wnl  A1c:5.7  Previous C/S x_3_ C/s done for: failure to descend and Breech presentation Request for records sent: Done at [facility] Pre-op completed with:___on:___ Delivery preference:_Repeat C-section__  History of postpartum Hemorrhage 2013 and 2015  H/o mental health diagnoses: __Anxiety and Depression____ Medications prior to pregnancy:None Medications during pregnancy:None Counseling:  EPDS 9  Trich on wet prep At [facility] on 01/30/23 Treated with Flagyl twice daily for 7 days starting 01/30/23 Anemia in Pregnancy Dx at [facility] 01/30/23 Hgb 9.8    Screening results and needs: NOB:  Medicaid Questionnaire: Completed 01/30/23 []  ACHD Program Depression Score: 9 MBT: O Positive   Ab screen: Neg   HIV:Neg   RPR:NR    Hep B:Neg  Hep C:NR  Pap: Do Postpartum  G/C:  Rubella:Immune    VZV: Immune TSH:5.187  HgA1c: 5.7 Aneuploidy:  First trimester:  MaternitT21:Too late to care    Second trimester (AFP/tetra): Too late to care 28 weeks:  Review Medicaid Questionnaire:Too late to care []  ACHD Program Depression Score:Done at [facility] on 5/3 Blood consent: Hgb: too late to care  Platelets:to late to care    Glucola:Too late to care.    Rhogam:N/A  36 weeks:  GBS:Neg   G/C:Neg/neg   Hgb:9.8  Platelets:285    HIV:Neg RPR:NR    Last [place]:  12/19/22: Normal anatomy seen, Single, viable IUP, S=[redacted]w[redacted]d EFW=4lb4oz(1933g)=47% FHR=153bpm AFI=17.70cm @ 65% Cervical length=4.41cm B/L ovaries appear wnl Placenta=anterior Position=vertex   Immunization:   Flu in season - Declined Tdap at  27-36 weeks - Too late to care Covid-19 -  RSV at 32-36 weeks - Out of Season Contraception Plan:  Feeding Plan:  Labor Plans:C-Section with TJS      . OB History     Gravida  5   Para  3   Term  3   Preterm      AB  2   Living  3      SAB      IAB  2   Ectopic      Multiple      Live Births  2        Obstetric Comments  Menstrual age: 87  Age 1st Pregnancy: 39        Past Medical History:  Diagnosis Date   Gallbladder & bile duct stone, acute cholecystitis and obstruction    Past Surgical History:  Procedure Laterality Date   CESAREAN SECTION     X 3, 2013, 2015, 2020   Family History: family history is not on file. Social History:  reports that she has never smoked. She has never used smokeless tobacco. She reports current drug use. Drug: Marijuana. She reports that she does not drink alcohol.      Review of Systems History   There were no vitals taken for this visit. Exam 02/06/23 Physical Exam  103/71  Lungs cta   Cv rrr without murmur  Abd : gravid   Prenatal labs: ABO, Rh: --/--/O POS Performed at  [facility], [address] [place], [place] [address]  [phone]) Antibody:  neg  Rubella:  Imm/ vz / imm RPR:   NR HBsAg:   neg HIV:   neg GBS:   neg  Assessment/Plan: Elective repeat LTCS 02/10/23 Poor prenatal care The risks of cesarean section discussed with the patient included but were not limited to: bleeding which may require transfusion or reoperation; infection which may require antibiotics; injury to bowel, bladder, ureters or other surrounding organs; injury to the fetus; need for additional procedures including hysterectomy in the event of a life-threatening hemorrhage; placental abnormalities wth subsequent pregnancies, incisional problems, thromboembolic phenomenon and other postoperative/anesthesia complications. The patient concurred with the proposed plan, giving informed written consent for the  procedure.  she will remain NPO for procedure. Anesthesia and OR aware. Preoperative prophylactic antibiotics and SCDs ordered on call to the OR.  To OR when ready.    Ihor Austin Susan Oneal 02/06/2023, 4:21 PM

## 2023-02-09 ENCOUNTER — Other Ambulatory Visit: Payer: Medicaid Other

## 2023-02-09 ENCOUNTER — Encounter
Admission: RE | Admit: 2023-02-09 | Discharge: 2023-02-09 | Disposition: A | Discharge status: Home / Self Care | Payer: Medicaid Other | Source: Ambulatory Visit | Attending: Obstetrics and Gynecology | Admitting: Obstetrics and Gynecology

## 2023-02-09 ENCOUNTER — Encounter: Payer: Self-pay | Admitting: Urgent Care

## 2023-02-09 DIAGNOSIS — Z01812 Encounter for preprocedural laboratory examination: Secondary | ICD-10-CM | POA: Insufficient documentation

## 2023-02-09 HISTORY — DX: Supervision of pregnancy with other poor reproductive or obstetric history, unspecified trimester: O09.299

## 2023-02-09 HISTORY — DX: Gastro-esophageal reflux disease without esophagitis: K21.9

## 2023-02-09 HISTORY — DX: Anemia, unspecified: D64.9

## 2023-02-09 HISTORY — DX: Depression, unspecified: F32.A

## 2023-02-09 HISTORY — DX: Abnormal levels of other serum enzymes: R74.8

## 2023-02-09 HISTORY — DX: Hypokalemia: E87.6

## 2023-02-09 HISTORY — DX: Anxiety disorder, unspecified: F41.9

## 2023-02-09 HISTORY — DX: Cannabis use, unspecified, uncomplicated: F12.90

## 2023-02-09 HISTORY — DX: Supervision of pregnancy with insufficient antenatal care, unspecified trimester: O09.30

## 2023-02-09 LAB — CBC
HCT: 33.2 % — ABNORMAL LOW (ref 36.0–46.0)
Hemoglobin: 10.5 g/dL — ABNORMAL LOW (ref 12.0–15.0)
MCH: 24.5 pg — ABNORMAL LOW (ref 26.0–34.0)
MCHC: 31.6 g/dL (ref 30.0–36.0)
MCV: 77.6 fL — ABNORMAL LOW (ref 80.0–100.0)
Platelets: 282 10*3/uL (ref 150–400)
RBC: 4.28 MIL/uL (ref 3.87–5.11)
RDW: 16.4 % — ABNORMAL HIGH (ref 11.5–15.5)
WBC: 9.3 10*3/uL (ref 4.0–10.5)
nRBC: 0 % (ref 0.0–0.2)

## 2023-02-09 LAB — BASIC METABOLIC PANEL
Anion gap: 8 (ref 5–15)
BUN: 6 mg/dL (ref 6–20)
CO2: 21 mmol/L — ABNORMAL LOW (ref 22–32)
Calcium: 8.6 mg/dL — ABNORMAL LOW (ref 8.9–10.3)
Chloride: 105 mmol/L (ref 98–111)
Creatinine, Ser: 0.61 mg/dL (ref 0.44–1.00)
GFR, Estimated: >60 mL/min (ref >60)
Glucose, Bld: 65 mg/dL — ABNORMAL LOW (ref 70–99)
Potassium: 3.5 mmol/L (ref 3.5–5.1)
Sodium: 134 mmol/L — ABNORMAL LOW (ref 135–145)

## 2023-02-09 LAB — TYPE AND SCREEN

## 2023-02-09 NOTE — Patient Instructions (Signed)
Your procedure is scheduled on:02-10-23 Tuesday. Arrive at 5:30 am  Arrival Time: Please call Labor and Delivery if you have any questions [phone].  Arrival: If your arrival time is prior to 6:00 am, please enter through the Emergency Room Entrance and you will be directed to Labor and Delivery. If your arrival time is 6:00 am or later, please enter the Medical Mall and follow the greeter's instructions.  REMEMBER: Instructions that are not followed completely may result in serious medical risk, up to and including death; or upon the discretion of your surgeon and anesthesiologist your surgery may need to be rescheduled.  Do not eat food OR drink any liquids after midnight the night before surgery.  No gum chewing or hard candies  One week prior to surgery: Stop Anti-inflammatories (NSAIDS) such as Advil, Aleve, Ibuprofen, Motrin, Naproxen, Naprosyn and Aspirin based products such as Excedrin, Goody's Powder, BC Powder You may however, continue to take Tylenol if needed for pain up until the day of surgery.  Do NOT take any medication the day of surgery  No Alcohol for 24 hours before or after surgery.  No Smoking including e-cigarettes for 24 hours prior to surgery.  No chewable tobacco products for at least 6 hours prior to surgery.  No nicotine patches on the day of surgery.  Do not use any "recreational" drugs for at least a week prior to your surgery.  Please be advised that the combination of cocaine and anesthesia may have negative outcomes, up to and including death. If you test positive for cocaine, your surgery will be cancelled.  On the morning of surgery brush your teeth with toothpaste and water, you may rinse your mouth with mouthwash if you wish. Do not swallow any toothpaste or mouthwash.  Use CHG soap as directed on instruction sheet (Avoid Nipple and Private Area)  Do not wear jewelry, make-up, hairpins, clips or nail polish.  Do not wear lotions, powders, or  perfumes.   Do not shave body hair from the neck down 48 hours before surgery.  Contact lenses, hearing aids and dentures may not be worn into surgery.  Do not bring valuables to the hospital. [facility] is not responsible for any missing/lost belongings or valuables.   Notify your doctor if there is any change in your medical condition (cold, fever, infection).  Wear comfortable clothing (specific to your surgery type) to the hospital.  After surgery, you can help prevent lung complications by doing breathing exercises.  Take deep breaths and cough every 1-2 hours. Your doctor may order a device called an Incentive Spirometer to help you take deep breaths. When coughing or sneezing, hold a pillow firmly against your incision with both hands. This is called "splinting." Doing this helps protect your incision. It also decreases belly discomfort.  Please call the Pre-admissions Testing Dept. at [phone] if you have any questions about these instructions.  Surgery Visitation Policy:  Visitor Passes   All visitors, including children, need an identification sticker when visiting. These stickers must be worn where they can be seen.   Labor & Delivery  Laboring women may have one designated support person and two other visitors of any age visit. The support person must remain the same. The visitors may switch with other visitors. Visitation is permitted 24 hours per day. The designated support person or a visitor over the age of 16 may sleep overnight in the patient's room. A doula registered with [facility] for labor and delivery support is  not considered a visitor. Doulas not registered with [facility] are considered visitors.  Mother Baby Unit, OB Specialty and Gynecological Care  A designated support person and three visitors of any age may visit. The three visitors may switch out. The designated support person or a visitor age 79 or older may stay overnight in the  room. During the postpartum period (up to 6 weeks), if the mother is the patient, she can have her newborn stay with her if there is another support person present who can be responsible for the baby.       Preparing for Surgery with CHLORHEXIDINE GLUCONATE (CHG) Soap  Chlorhexidine Gluconate (CHG) Soap  o An antiseptic cleaner that kills germs and bonds with the skin to continue killing germs even after washing  o Used for showering the night before surgery and morning of surgery  Before surgery, you can play an important role by reducing the number of germs on your skin.  CHG (Chlorhexidine gluconate) soap is an antiseptic cleanser which kills germs and bonds with the skin to continue killing germs even after washing.  Please do not use if you have an allergy to CHG or antibacterial soaps. If your skin becomes reddened/irritated stop using the CHG.  1. Shower the NIGHT BEFORE SURGERY and the MORNING OF SURGERY with CHG soap.  2. If you choose to wash your hair, wash your hair first as usual with your normal shampoo.  3. After shampooing, rinse your hair and body thoroughly to remove the shampoo.  4. Use CHG as you would any other liquid soap. You can apply CHG directly to the skin and wash gently with a scrungie or a clean washcloth.  5. Apply the CHG soap to your body only from the neck down. Do not use on open wounds or open sores. Avoid contact with your eyes, ears, mouth, and genitals (private parts). Wash face and genitals (private parts) with your normal soap.  6. Wash thoroughly, paying special attention to the area where your surgery will be performed.  7. Thoroughly rinse your body with warm water.  8. Do not shower/wash with your normal soap after using and rinsing off the CHG soap.  9. Pat yourself dry with a clean towel.  10. Wear clean pajamas to bed the night before surgery.  12. Place clean sheets on your bed the night of your first shower and do not sleep with  pets.  13. Shower again with the CHG soap on the day of surgery prior to arriving at the hospital.  14. Do not apply any deodorants/lotions/powders.  15. Please wear clean clothes to the hospital.  How to Use an Incentive Spirometer An incentive spirometer is a tool that measures how well you are filling your lungs with each breath. Learning to take long, deep breaths using this tool can help you keep your lungs clear and active. This may help to reverse or lessen your chance of developing breathing (pulmonary) problems, especially infection. You may be asked to use a spirometer: After a surgery. If you have a lung problem or a history of smoking. After a long period of time when you have been unable to move or be active. If the spirometer includes an indicator to show the highest number that you have reached, your health care provider or respiratory therapist will help you set a goal. Keep a log of your progress as told by your health care provider. What are the risks? Breathing too quickly may cause dizziness  or cause you to pass out. Take your time so you do not get dizzy or light-headed. If you are in pain, you may need to take pain medicine before doing incentive spirometry. It is harder to take a deep breath if you are having pain. How to use your incentive spirometer  Sit up on the edge of your bed or on a chair. Hold the incentive spirometer so that it is in an upright position. Before you use the spirometer, breathe out normally. Place the mouthpiece in your mouth. Make sure your lips are closed tightly around it. Breathe in slowly and as deeply as you can through your mouth, causing the piston or the ball to rise toward the top of the chamber. Hold your breath for 3-5 seconds, or for as long as possible. If the spirometer includes a coach indicator, use this to guide you in breathing. Slow down your breathing if the indicator goes above the marked areas. Remove the mouthpiece from  your mouth and breathe out normally. The piston or ball will return to the bottom of the chamber. Rest for a few seconds, then repeat the steps 10 or more times. Take your time and take a few normal breaths between deep breaths so that you do not get dizzy or light-headed. Do this every 1-2 hours when you are awake. If the spirometer includes a goal marker to show the highest number you have reached (best effort), use this as a goal to work toward during each repetition. After each set of 10 deep breaths, cough a few times. This will help to make sure that your lungs are clear. If you have an incision on your chest or abdomen from surgery, place a pillow or a rolled-up towel firmly against the incision when you cough. This can help to reduce pain while taking deep breaths and coughing. General tips When you are able to get out of bed: Walk around often. Continue to take deep breaths and cough in order to clear your lungs. Keep using the incentive spirometer until your health care provider says it is okay to stop using it. If you have been in the hospital, you may be told to keep using the spirometer at home. Contact a health care provider if: You are having difficulty using the spirometer. You have trouble using the spirometer as often as instructed. Your pain medicine is not giving enough relief for you to use the spirometer as told. You have a fever. Get help right away if: You develop shortness of breath. You develop a cough with bloody mucus from the lungs. You have fluid or blood coming from an incision site after you cough. Summary An incentive spirometer is a tool that can help you learn to take long, deep breaths to keep your lungs clear and active. You may be asked to use a spirometer after a surgery, if you have a lung problem or a history of smoking, or if you have been inactive for a long period of time. Use your incentive spirometer as instructed every 1-2 hours while you are  awake. If you have an incision on your chest or abdomen, place a pillow or a rolled-up towel firmly against your incision when you cough. This will help to reduce pain. Get help right away if you have shortness of breath, you cough up bloody mucus, or blood comes from your incision when you cough. This information is not intended to replace advice given to you by your health care provider. Make  sure you discuss any questions you have with your health care provider. Document Revised: 12/05/2019 Document Reviewed: 12/05/2019 Elsevier Patient Education  2023 [employer].

## 2023-02-10 ENCOUNTER — Inpatient Hospital Stay: Payer: Medicaid Other | Admitting: Registered Nurse

## 2023-02-10 ENCOUNTER — Other Ambulatory Visit: Payer: Self-pay

## 2023-02-10 ENCOUNTER — Encounter: Payer: Self-pay | Admitting: Obstetrics and Gynecology

## 2023-02-10 ENCOUNTER — Encounter
Admission: RE | Disposition: A | Discharge status: Home / Self Care | Payer: Self-pay | Source: Home / Self Care | Attending: Obstetrics and Gynecology

## 2023-02-10 ENCOUNTER — Inpatient Hospital Stay
Admission: RE | Admit: 2023-02-10 | Discharge: 2023-02-13 | DRG: 787 | Disposition: A | Discharge status: Home / Self Care | Payer: Medicaid Other | Attending: Obstetrics and Gynecology | Admitting: Obstetrics and Gynecology

## 2023-02-10 DIAGNOSIS — O34211 Maternal care for low transverse scar from previous cesarean delivery: Principal | ICD-10-CM | POA: Diagnosis present

## 2023-02-10 DIAGNOSIS — O99214 Obesity complicating childbirth: Secondary | ICD-10-CM | POA: Diagnosis present

## 2023-02-10 DIAGNOSIS — O9902 Anemia complicating childbirth: Secondary | ICD-10-CM | POA: Diagnosis present

## 2023-02-10 DIAGNOSIS — D62 Acute posthemorrhagic anemia: Secondary | ICD-10-CM | POA: Diagnosis not present

## 2023-02-10 DIAGNOSIS — Z3A39 39 weeks gestation of pregnancy: Secondary | ICD-10-CM

## 2023-02-10 DIAGNOSIS — Z01818 Encounter for other preprocedural examination: Secondary | ICD-10-CM

## 2023-02-10 DIAGNOSIS — O34219 Maternal care for unspecified type scar from previous cesarean delivery: Secondary | ICD-10-CM | POA: Diagnosis present

## 2023-02-10 LAB — CBC
HCT: 32.3 % — ABNORMAL LOW (ref 36.0–46.0)
HCT: 26.8 % — ABNORMAL LOW (ref 36.0–46.0)
HCT: 26.9 % — ABNORMAL LOW (ref 36.0–46.0)
Hemoglobin: 10.1 g/dL — ABNORMAL LOW (ref 12.0–15.0)
Hemoglobin: 8.4 g/dL — ABNORMAL LOW (ref 12.0–15.0)
Hemoglobin: 8.4 g/dL — ABNORMAL LOW (ref 12.0–15.0)
MCH: 24.2 pg — ABNORMAL LOW (ref 26.0–34.0)
MCH: 24.5 pg — ABNORMAL LOW (ref 26.0–34.0)
MCH: 24.3 pg — ABNORMAL LOW (ref 26.0–34.0)
MCHC: 31.3 g/dL (ref 30.0–36.0)
MCHC: 31.3 g/dL (ref 30.0–36.0)
MCHC: 31.2 g/dL (ref 30.0–36.0)
MCV: 77.5 fL — ABNORMAL LOW (ref 80.0–100.0)
MCV: 78.1 fL — ABNORMAL LOW (ref 80.0–100.0)
MCV: 78 fL — ABNORMAL LOW (ref 80.0–100.0)
Platelets: 282 10*3/uL (ref 150–400)
Platelets: 234 10*3/uL (ref 150–400)
Platelets: 237 10*3/uL (ref 150–400)
RBC: 4.17 MIL/uL (ref 3.87–5.11)
RBC: 3.43 MIL/uL — ABNORMAL LOW (ref 3.87–5.11)
RBC: 3.45 MIL/uL — ABNORMAL LOW (ref 3.87–5.11)
RDW: 16.4 % — ABNORMAL HIGH (ref 11.5–15.5)
RDW: 16.5 % — ABNORMAL HIGH (ref 11.5–15.5)
RDW: 16.5 % — ABNORMAL HIGH (ref 11.5–15.5)
WBC: 9.9 10*3/uL (ref 4.0–10.5)
WBC: 16.1 10*3/uL — ABNORMAL HIGH (ref 4.0–10.5)
WBC: 19.4 10*3/uL — ABNORMAL HIGH (ref 4.0–10.5)
nRBC: 0 % (ref 0.0–0.2)
nRBC: 0 % (ref 0.0–0.2)
nRBC: 0 % (ref 0.0–0.2)

## 2023-02-10 LAB — BASIC METABOLIC PANEL
Anion gap: 6 (ref 5–15)
BUN: 7 mg/dL (ref 6–20)
CO2: 21 mmol/L — ABNORMAL LOW (ref 22–32)
Calcium: 8.6 mg/dL — ABNORMAL LOW (ref 8.9–10.3)
Chloride: 107 mmol/L (ref 98–111)
Creatinine, Ser: 0.6 mg/dL (ref 0.44–1.00)
GFR, Estimated: >60 mL/min (ref >60)
Glucose, Bld: 91 mg/dL (ref 70–99)
Potassium: 3.6 mmol/L (ref 3.5–5.1)
Sodium: 134 mmol/L — ABNORMAL LOW (ref 135–145)

## 2023-02-10 LAB — TYPE AND SCREEN
ABO/RH(D): O POS
ABO/RH(D): O POS
Antibody Screen: NEGATIVE

## 2023-02-10 LAB — RPR
RPR Ser Ql: NONREACTIVE
RPR Ser Ql: NONREACTIVE

## 2023-02-10 SURGERY — Surgical Case
Anesthesia: Spinal

## 2023-02-10 MED ORDER — KETOROLAC TROMETHAMINE 30 MG/ML IJ SOLN: 30.0000 mg | Freq: Three times a day (TID) | INTRAMUSCULAR | Status: AC | PRN

## 2023-02-10 MED ORDER — FENTANYL CITRATE (PF) 100 MCG/2ML IJ SOLN
INTRAMUSCULAR | Status: DC | PRN
  Administered 2023-02-10: 35 ug via INTRAVENOUS
  Administered 2023-02-10 (×2): 50 ug via INTRAVENOUS

## 2023-02-10 MED ORDER — ACETAMINOPHEN 500 MG PO TABS: 1000.0000 mg | ORAL_TABLET | Freq: Once | ORAL | Status: DC

## 2023-02-10 MED ORDER — OXYTOCIN-SODIUM CHLORIDE 30-0.9 UT/500ML-% IV SOLN
2.5000 [IU]/h | INTRAVENOUS | Status: AC
  Administered 2023-02-10: 2.5 [IU]/h via INTRAVENOUS

## 2023-02-10 MED ORDER — COCONUT OIL OIL: 1.0000 | TOPICAL_OIL | Status: DC | PRN

## 2023-02-10 MED ORDER — OXYCODONE HCL 5 MG PO TABS: 5.0000 mg | ORAL_TABLET | Freq: Four times a day (QID) | ORAL | Status: DC | PRN

## 2023-02-10 MED ORDER — ACETAMINOPHEN 500 MG PO TABS
1000.0000 mg | ORAL_TABLET | ORAL | Status: AC
  Administered 2023-02-10: 1000 mg via ORAL
  Filled 2023-02-10: qty 2

## 2023-02-10 MED ORDER — WITCH HAZEL-GLYCERIN EX PADS: 1.0000 | MEDICATED_PAD | CUTANEOUS | Status: DC | PRN

## 2023-02-10 MED ORDER — IBUPROFEN 600 MG PO TABS
600.0000 mg | ORAL_TABLET | Freq: Four times a day (QID) | ORAL | Status: DC
  Administered 2023-02-11 – 2023-02-13 (×7): 600 mg via ORAL
  Filled 2023-02-10 (×7): qty 1

## 2023-02-10 MED ORDER — DEXAMETHASONE SODIUM PHOSPHATE 10 MG/ML IJ SOLN
INTRAMUSCULAR | Status: DC | PRN
  Administered 2023-02-10: 8 mg via INTRAVENOUS

## 2023-02-10 MED ORDER — SOD CITRATE-CITRIC ACID 500-334 MG/5ML PO SOLN
ORAL | Status: AC
  Filled 2023-02-10: qty 15

## 2023-02-10 MED ORDER — CEFAZOLIN SODIUM-DEXTROSE 2-4 GM/100ML-% IV SOLN
2.0000 g | Freq: Once | INTRAVENOUS | Status: AC
  Administered 2023-02-10: 2 g via INTRAVENOUS
  Filled 2023-02-10: qty 100

## 2023-02-10 MED ORDER — METHYLERGONOVINE MALEATE 0.2 MG/ML IJ SOLN
INTRAMUSCULAR | Status: AC
  Filled 2023-02-10: qty 1

## 2023-02-10 MED ORDER — SOD CITRATE-CITRIC ACID 500-334 MG/5ML PO SOLN
30.0000 mL | ORAL | Status: AC
  Administered 2023-02-10: 30 mL via ORAL

## 2023-02-10 MED ORDER — FENTANYL CITRATE (PF) 100 MCG/2ML IJ SOLN
INTRAMUSCULAR | Status: AC
  Filled 2023-02-10: qty 2

## 2023-02-10 MED ORDER — TRANEXAMIC ACID-NACL 1000-0.7 MG/100ML-% IV SOLN
INTRAVENOUS | Status: AC
  Filled 2023-02-10: qty 100

## 2023-02-10 MED ORDER — SODIUM CHLORIDE 0.9% FLUSH: 3.0000 mL | INTRAVENOUS | Status: DC | PRN

## 2023-02-10 MED ORDER — MENTHOL 3 MG MT LOZG: 1.0000 | LOZENGE | OROMUCOSAL | Status: DC | PRN

## 2023-02-10 MED ORDER — SCOPOLAMINE 1 MG/3DAYS TD PT72
1.0000 | MEDICATED_PATCH | Freq: Once | TRANSDERMAL | Status: AC
  Administered 2023-02-10: 1.5 mg via TRANSDERMAL
  Filled 2023-02-10: qty 1

## 2023-02-10 MED ORDER — ONDANSETRON HCL 4 MG/2ML IJ SOLN: 4.0000 mg | Freq: Three times a day (TID) | INTRAMUSCULAR | Status: DC | PRN

## 2023-02-10 MED ORDER — MEPERIDINE HCL 25 MG/ML IJ SOLN: 6.2500 mg | INTRAMUSCULAR | Status: DC | PRN

## 2023-02-10 MED ORDER — EPHEDRINE SULFATE (PRESSORS) 50 MG/ML IJ SOLN
INTRAMUSCULAR | Status: DC | PRN
  Administered 2023-02-10: 5 mg via INTRAVENOUS

## 2023-02-10 MED ORDER — GABAPENTIN 300 MG PO CAPS
300.0000 mg | ORAL_CAPSULE | ORAL | Status: AC
  Administered 2023-02-10: 300 mg via ORAL
  Filled 2023-02-10: qty 1

## 2023-02-10 MED ORDER — KETOROLAC TROMETHAMINE 30 MG/ML IJ SOLN: 30.0000 mg | Freq: Four times a day (QID) | INTRAMUSCULAR | Status: DC | PRN

## 2023-02-10 MED ORDER — GABAPENTIN 300 MG PO CAPS: 300.0000 mg | ORAL_CAPSULE | Freq: Once | ORAL | Status: DC

## 2023-02-10 MED ORDER — NALOXONE HCL 0.4 MG/ML IJ SOLN: 0.4000 mg | INTRAMUSCULAR | Status: DC | PRN

## 2023-02-10 MED ORDER — MIDAZOLAM HCL 2 MG/2ML IJ SOLN
INTRAMUSCULAR | Status: AC
  Filled 2023-02-10: qty 2

## 2023-02-10 MED ORDER — ONDANSETRON HCL 4 MG/2ML IJ SOLN
INTRAMUSCULAR | Status: DC | PRN
  Administered 2023-02-10: 4 mg via INTRAVENOUS

## 2023-02-10 MED ORDER — OXYCODONE HCL 5 MG PO TABS
5.0000 mg | ORAL_TABLET | ORAL | Status: DC | PRN
  Administered 2023-02-10 – 2023-02-12 (×10): 10 mg via ORAL
  Filled 2023-02-10 (×3): qty 2
  Filled 2023-02-10: qty 1
  Filled 2023-02-10 (×5): qty 2
  Filled 2023-02-10: qty 1
  Filled 2023-02-10: qty 2

## 2023-02-10 MED ORDER — SIMETHICONE 80 MG PO CHEW
80.0000 mg | CHEWABLE_TABLET | Freq: Three times a day (TID) | ORAL | Status: DC
  Administered 2023-02-10 – 2023-02-13 (×9): 80 mg via ORAL
  Filled 2023-02-10 (×10): qty 1

## 2023-02-10 MED ORDER — OXYTOCIN-SODIUM CHLORIDE 30-0.9 UT/500ML-% IV SOLN
INTRAVENOUS | Status: AC
  Filled 2023-02-10: qty 500

## 2023-02-10 MED ORDER — PHENYLEPHRINE HCL-NACL 20-0.9 MG/250ML-% IV SOLN
INTRAVENOUS | Status: DC | PRN
  Administered 2023-02-10: 50 ug/min via INTRAVENOUS

## 2023-02-10 MED ORDER — SENNOSIDES-DOCUSATE SODIUM 8.6-50 MG PO TABS
2.0000 | ORAL_TABLET | Freq: Every day | ORAL | Status: DC
  Administered 2023-02-11 – 2023-02-13 (×3): 2 via ORAL
  Filled 2023-02-10 (×3): qty 2

## 2023-02-10 MED ORDER — FAMOTIDINE 20 MG PO TABS
20.0000 mg | ORAL_TABLET | Freq: Once | ORAL | Status: AC
  Administered 2023-02-10: 20 mg via ORAL
  Filled 2023-02-10: qty 1

## 2023-02-10 MED ORDER — MORPHINE SULFATE (PF) 0.5 MG/ML IJ SOLN
INTRAMUSCULAR | Status: DC | PRN
  Administered 2023-02-10: .1 mg via INTRATHECAL

## 2023-02-10 MED ORDER — MIDAZOLAM HCL 2 MG/2ML IJ SOLN
INTRAMUSCULAR | Status: DC | PRN
  Administered 2023-02-10: 2 mg via INTRAVENOUS

## 2023-02-10 MED ORDER — KETAMINE HCL 50 MG/ML IJ SOLN
INTRAMUSCULAR | Status: DC | PRN
  Administered 2023-02-10 (×3): 50 mg via INTRAVENOUS

## 2023-02-10 MED ORDER — DIPHENHYDRAMINE HCL 50 MG/ML IJ SOLN: 12.5000 mg | INTRAMUSCULAR | Status: DC | PRN

## 2023-02-10 MED ORDER — MORPHINE SULFATE (PF) 2 MG/ML IV SOLN: 1.0000 mg | INTRAVENOUS | Status: DC | PRN

## 2023-02-10 MED ORDER — DIBUCAINE (PERIANAL) 1 % EX OINT: 1.0000 | TOPICAL_OINTMENT | CUTANEOUS | Status: DC | PRN

## 2023-02-10 MED ORDER — SIMETHICONE 80 MG PO CHEW
80.0000 mg | CHEWABLE_TABLET | ORAL | Status: DC | PRN
  Administered 2023-02-10 (×2): 80 mg via ORAL
  Filled 2023-02-10 (×2): qty 1

## 2023-02-10 MED ORDER — MORPHINE SULFATE (PF) 0.5 MG/ML IJ SOLN
INTRAMUSCULAR | Status: AC
  Filled 2023-02-10: qty 10

## 2023-02-10 MED ORDER — LACTATED RINGERS IV BOLUS
1000.0000 mL | Freq: Once | INTRAVENOUS | Status: AC
  Administered 2023-02-10: 1000 mL via INTRAVENOUS

## 2023-02-10 MED ORDER — ENOXAPARIN SODIUM 60 MG/0.6ML IJ SOSY
50.0000 mg | PREFILLED_SYRINGE | INTRAMUSCULAR | Status: DC
  Administered 2023-02-11 – 2023-02-13 (×3): 50 mg via SUBCUTANEOUS
  Filled 2023-02-10 (×3): qty 0.6

## 2023-02-10 MED ORDER — CEFAZOLIN SODIUM-DEXTROSE 2-4 GM/100ML-% IV SOLN: 2.0000 g | INTRAVENOUS | Status: DC

## 2023-02-10 MED ORDER — ORAL CARE MOUTH RINSE: 15.0000 mL | Freq: Once | OROMUCOSAL | Status: AC

## 2023-02-10 MED ORDER — ZOLPIDEM TARTRATE 5 MG PO TABS: 5.0000 mg | ORAL_TABLET | Freq: Every evening | ORAL | Status: DC | PRN

## 2023-02-10 MED ORDER — DIPHENHYDRAMINE HCL 25 MG PO CAPS: 25.0000 mg | ORAL_CAPSULE | ORAL | Status: DC | PRN

## 2023-02-10 MED ORDER — PRENATAL MULTIVITAMIN CH
1.0000 | ORAL_TABLET | Freq: Every day | ORAL | Status: DC
  Administered 2023-02-10 – 2023-02-13 (×4): 1 via ORAL
  Filled 2023-02-10 (×4): qty 1

## 2023-02-10 MED ORDER — BUPIVACAINE HCL (PF) 0.25 % IJ SOLN
INTRAMUSCULAR | Status: AC
  Filled 2023-02-10: qty 60

## 2023-02-10 MED ORDER — NALOXONE HCL 4 MG/10ML IJ SOLN: 1.0000 ug/kg/h | INTRAVENOUS | Status: DC | PRN

## 2023-02-10 MED ORDER — KETAMINE HCL 50 MG/ML IJ SOLN
INTRAMUSCULAR | Status: AC
  Filled 2023-02-10: qty 10

## 2023-02-10 MED ORDER — LACTATED RINGERS IV SOLN: Freq: Once | INTRAVENOUS | Status: DC

## 2023-02-10 MED ORDER — ACETAMINOPHEN 500 MG PO TABS
1000.0000 mg | ORAL_TABLET | Freq: Four times a day (QID) | ORAL | Status: DC
  Administered 2023-02-10 – 2023-02-12 (×7): 1000 mg via ORAL
  Filled 2023-02-10 (×7): qty 2

## 2023-02-10 MED ORDER — LACTATED RINGERS IV SOLN: INTRAVENOUS | Status: DC

## 2023-02-10 MED ORDER — FENTANYL CITRATE (PF) 100 MCG/2ML IJ SOLN
INTRAMUSCULAR | Status: DC | PRN
  Administered 2023-02-10: 15 ug via INTRATHECAL

## 2023-02-10 MED ORDER — POVIDONE-IODINE 10 % EX SWAB: 2.0000 | Freq: Once | CUTANEOUS | Status: DC

## 2023-02-10 MED ORDER — GABAPENTIN 300 MG PO CAPS
300.0000 mg | ORAL_CAPSULE | Freq: Every day | ORAL | Status: DC
  Administered 2023-02-10 – 2023-02-13 (×3): 300 mg via ORAL
  Filled 2023-02-10 (×3): qty 1

## 2023-02-10 MED ORDER — CHLORHEXIDINE GLUCONATE 0.12 % MT SOLN
15.0000 mL | Freq: Once | OROMUCOSAL | Status: AC
  Administered 2023-02-10: 15 mL via OROMUCOSAL
  Filled 2023-02-10: qty 15

## 2023-02-10 MED ORDER — DIPHENHYDRAMINE HCL 25 MG PO CAPS
25.0000 mg | ORAL_CAPSULE | Freq: Four times a day (QID) | ORAL | Status: DC | PRN
  Administered 2023-02-11: 25 mg via ORAL
  Filled 2023-02-10: qty 1

## 2023-02-10 MED ORDER — BUPIVACAINE IN DEXTROSE 0.75-8.25 % IT SOLN
INTRATHECAL | Status: DC | PRN
  Administered 2023-02-10: 1.4 mL via INTRATHECAL

## 2023-02-10 SURGICAL SUPPLY — 36 items
APL PRP STRL LF DISP 70% ISPRP (MISCELLANEOUS) ×1
BARRIER ADHS 3X4 INTERCEED (GAUZE/BANDAGES/DRESSINGS) ×1 IMPLANT
BRR ADH 4X3 ABS CNTRL BYND (GAUZE/BANDAGES/DRESSINGS) ×1
CHLORAPREP W/TINT 26 (MISCELLANEOUS) ×1 IMPLANT
DRSG TELFA 3X8 NADH STRL (GAUZE/BANDAGES/DRESSINGS) ×1 IMPLANT
ELECT CAUTERY BLADE 6.4 (BLADE) ×1 IMPLANT
ELECT REM PT RETURN 9FT ADLT (ELECTROSURGICAL) ×1
ELECTRODE REM PT RTRN 9FT ADLT (ELECTROSURGICAL) ×1 IMPLANT
GAUZE SPONGE 4X4 12PLY STRL (GAUZE/BANDAGES/DRESSINGS) ×1 IMPLANT
GLOVE SURG SYN 8.0 (GLOVE) ×1 IMPLANT
GLOVE SURG SYN 8.0 PF PI (GLOVE) ×1 IMPLANT
GOWN STRL REUS W/ TWL LRG LVL3 (GOWN DISPOSABLE) ×2 IMPLANT
GOWN STRL REUS W/ TWL XL LVL3 (GOWN DISPOSABLE) ×1 IMPLANT
GOWN STRL REUS W/TWL LRG LVL3 (GOWN DISPOSABLE) ×2
GOWN STRL REUS W/TWL XL LVL3 (GOWN DISPOSABLE) ×1
MANIFOLD NEPTUNE II (INSTRUMENTS) ×1 IMPLANT
MAT PREVALON FULL STRYKER (MISCELLANEOUS) ×1 IMPLANT
NDL HYPO 22X1.5 SAFETY MO (MISCELLANEOUS) ×1 IMPLANT
NEEDLE HYPO 22X1.5 SAFETY MO (MISCELLANEOUS) ×1 IMPLANT
NS IRRIG 1000ML POUR BTL (IV SOLUTION) ×1 IMPLANT
PACK C SECTION AR (MISCELLANEOUS) ×1 IMPLANT
PAD OB MATERNITY 4.3X12.25 (PERSONAL CARE ITEMS) ×1 IMPLANT
PAD PREP OB/GYN DISP 24X41 (PERSONAL CARE ITEMS) ×1 IMPLANT
SCRUB CHG 4% DYNA-HEX 4OZ (MISCELLANEOUS) ×1 IMPLANT
STRAP SAFETY 5IN WIDE (MISCELLANEOUS) ×1 IMPLANT
SUCT VACUUM KIWI BELL (SUCTIONS) IMPLANT
SUT CHROMIC 1 CTX 36 (SUTURE) ×3 IMPLANT
SUT PLAIN GUT 0 (SUTURE) ×2 IMPLANT
SUT VIC AB 0 CT1 36 (SUTURE) ×2 IMPLANT
SUT VIC AB 2-0 CT1 27 (SUTURE) ×2
SUT VIC AB 2-0 CT1 TAPERPNT 27 (SUTURE) IMPLANT
SUT VIC AB 2-0 SH 27 (SUTURE) ×1
SUT VIC AB 2-0 SH 27XBRD (SUTURE) IMPLANT
SYR 30ML LL (SYRINGE) ×2 IMPLANT
TRAP FLUID SMOKE EVACUATOR (MISCELLANEOUS) ×1 IMPLANT
WATER STERILE IRR 500ML POUR (IV SOLUTION) ×1 IMPLANT

## 2023-02-10 NOTE — Op Note (Signed)
Susan Oneal, Susan Oneal MEDICAL RECORD NO: [id number] ACCOUNT NO: [id number] DATE OF BIRTH: [birth year] FACILITY: [facility] LOCATION: [facility] PHYSICIAN: Suzy Bouchard, MD  Operative Report   DATE OF PROCEDURE: 02/10/2023  PREOPERATIVE DIAGNOSES:   1.  39+5 weeks estimated gestational age. 2.  Poor prenatal care. 3.  Elective repeat cesarean section with 3 prior cesarean sections.  POSTOPERATIVE DIAGNOSES: 1.  39+5 weeks estimated gestational age. 2.  Poor prenatal care. 3.  Elective repeat cesarean section with 3 prior cesarean sections. 4.  Significant uterine adhesions. 5. Postpartum hemorrhage. 6.  Vigorous female, delivered.  PROCEDURES: 1.  Repeat low transverse cesarean section. 2.  Lysis of adhesions.  SURGEON:  Suzy Bouchard, MD  FIRST ASSISTANT:  Haroldine Laws, certified nurse midwife.  ANESTHESIA:  Spinal.  INDICATIONS:  A 32 year old gravida 6, para 3 with 3 prior cesarean sections elects for a repeat cesarean section.  The patient declines sterilization.  Estimated gestational age 76+5 weeks.  DESCRIPTION OF PROCEDURE:  After adequate spinal anesthesia, the patient was placed in dorsal supine position, hip roll on the right side.  The patient's abdomen was prepped and draped in normal sterile fashion.  The patient did receive 2 grams of IV  Ancef prior to commencement for surgical prophylaxis.  Timeout was performed.  A Pfannenstiel incision was made 2 fingerbreadths above the symphysis pubis.  Sharp dissection was used to identify the fascia.  Fascia was opened in the midline and opened in  transverse fashion.  Superior aspect of fascia was grasped with Kocher clamps and the recti muscles were dissected free.  Inferior aspect of the fascia was grasped with Kocher clamps and the pyramidalis muscle was dissected free.  Dense adhesions were  noted.  Sharp dissection was used to gain entrance into the peritoneum.  Multiple uterine adhesions on the anterior  cervix of the uterus were taken down sharply with the Bovie.  Ultimately, the lower uterine segment was identified and a bladder blade was  placed to reflect the bladder inferiorly.  A low transverse uterine incision was made.  Upon entry into the endometrial cavity, clear fluid resulted.  The uterine incision was extended with blunt transverse traction.  Fetal head was then brought to the  incision with fundal pressure, the head was delivered and the umbilical cord was reduced and the shoulders and body of the infant were delivered without difficulty.  Vigorous female was then spontaneously crying.  Delayed cord clamping for 60 seconds.  The  infant was dried, suctioned and after 60 seconds cord was clamped and vigorous female was passed to nursery staff who assigned Apgar scores of 8 and 9, fetal weight 3360 grams.  Placenta was manually delivered and the uterus was exteriorized.  Large  serosal defect noted on the anterior surface of the uterus from the lower uterine segment to the fundus.  The endometrial cavity was wiped clean with the laparotomy tape and the uterine incision was closed with 1-0 chromic suture in a running locking  fashion.  For over the next hour multiple sutures were placed to control the bleeding serosa and myometrial tissues beneath.  Ultimately, good hemostasis was noted.  The posterior cul-de-sac was irrigated and suctioned and the uterus was placed back into  the abdominal cavity and the pericolic gutters were wiped clean with laparotomy tape.  Uterine incision and anterior uterus appeared hemostatic.  Interceed was placed over the uterine incision in T-shape fashion and the fascia was then closed with 0  Vicryl suture in  a running nonlocking fashion, two separate sutures used.  Fascial edges were injected with a solution of 60 mL 0.25% Marcaine plus 20 mL normal saline.  20 mL solution was injected at the fascial edges.  Subcutaneous tissues were  irrigated and bovied and the skin  was reapproximated with Insorb absorbable staples and additional 20 mL of Marcaine solution injected beneath the skin.  The patient remained hemodynamically stable throughout the procedure.  QUANTITATIVE BLOOD LOSS:  1930 mL.  URINE OUTPUT:  175 mL  INTRAOPERATIVE FLUIDS:  1200 mL.  DISPOSITION:  The patient was taken to recovery room in good condition.   PUS D: 02/10/2023 10:00:55 am T: 02/10/2023 11:01:00 am  JOB: [id number]/ [id number]

## 2023-02-10 NOTE — Anesthesia Preprocedure Evaluation (Signed)
Anesthesia Evaluation  Patient identified by MRN, date of birth, ID band Patient awake    Reviewed: Allergy & Precautions, NPO status , Patient's Chart, lab work & pertinent test results  Airway Mallampati: IV  TM Distance: >3 FB Neck ROM: full    Dental no notable dental hx.    Pulmonary neg pulmonary ROS   Pulmonary exam normal        Cardiovascular Exercise Tolerance: Good negative cardio ROS Normal cardiovascular exam     Neuro/Psych  PSYCHIATRIC DISORDERS Anxiety Depression       GI/Hepatic ,GERD  Controlled,,  Endo/Other    Renal/GU   negative genitourinary   Musculoskeletal   Abdominal   Peds  Hematology  (+) Blood dyscrasia, anemia   Anesthesia Other Findings Past Medical History: No date: Anemia No date: Anxiety No date: Chronic hypokalemia No date: Depression No date: Elevated alkaline phosphatase level No date: Gallbladder & bile duct stone, acute cholecystitis and  obstruction No date: GERD (gastroesophageal reflux disease) No date: H/O postpartum hemorrhage, currently pregnant No date: Late prenatal care No date: Marijuana use 01/2023: Trichomoniasis  Past Surgical History: No date: CESAREAN SECTION     Comment:  X 3, 2013, 2015, 2020 No date: KELOID EXCISION     Comment:  as a teenager  BMI    Body Mass Index: 35.57 kg/m      Reproductive/Obstetrics (+) Pregnancy                              Anesthesia Physical Anesthesia Plan  ASA: 2  Anesthesia Plan: Spinal   Post-op Pain Management:    Induction:   PONV Risk Score and Plan:   Airway Management Planned: Natural Airway and Nasal Cannula  Additional Equipment:   Intra-op Plan:   Post-operative Plan:   Informed Consent: I have reviewed the patients History and Physical, chart, labs and discussed the procedure including the risks, benefits and alternatives for the proposed anesthesia with the  patient or authorized representative who has indicated his/her understanding and acceptance.     Dental Advisory Given  Plan Discussed with: Anesthesiologist  Anesthesia Plan Comments: (Patient reports no bleeding problems and no anticoagulant use.  Plan for spinal with backup GA  Patient consented for risks of anesthesia including but not limited to:  - adverse reactions to medications - damage to eyes, teeth, lips or other oral mucosa - nerve damage due to positioning  - risk of bleeding, infection and or nerve damage from spinal that could lead to paralysis - risk of headache or failed spinal - damage to teeth, lips or other oral mucosa - sore throat or hoarseness - damage to heart, brain, nerves, lungs, other parts of body or loss of life  Patient voiced understanding.)         Anesthesia Quick Evaluation

## 2023-02-10 NOTE — Transfer of Care (Signed)
Immediate Anesthesia Transfer of Care Note  Patient: Susan Oneal  Procedure(s) Performed: REPEAT CESAREAN SECTION  Patient Location: Mother/Baby  Anesthesia Type:Spinal  Level of Consciousness: drowsy  Airway & Oxygen Therapy: Patient Spontanous Breathing  Post-op Assessment: Report given to RN  Post vital signs: stable  Last Vitals:  Vitals Value Taken Time  BP    Temp    Pulse    Resp    SpO2      Last Pain:  Vitals:   02/10/23 0714  TempSrc: Oral  PainSc: 0-No pain         Complications: No notable events documented.

## 2023-02-10 NOTE — Anesthesia Procedure Notes (Signed)
Spinal  Patient location during procedure: OB Start time: 02/10/2023 8:02 AM Reason for block: surgical anesthesia Staffing Performed: resident/CRNA  Performed by: Jaye Beagle, CRNA Authorized by: Louie Boston, MD   Preanesthetic Checklist Completed: patient identified, IV checked, site marked, risks and benefits discussed, surgical consent, monitors and equipment checked, pre-op evaluation and timeout performed Spinal Block Patient position: sitting Prep: DuraPrep Patient monitoring: heart rate, cardiac monitor, continuous pulse ox and blood pressure Approach: midline Location: L3-4 Injection technique: single-shot Needle Needle type: Sprotte  Needle gauge: 24 G Needle length: 9 cm Assessment Sensory level: T4 Events: CSF return Additional Notes Atraumatic attempt X1.  Pt tolerated well.  No pain with injection, negative heme, negative paresthesia, good free flow CSF pre/post injection.

## 2023-02-10 NOTE — Discharge Summary (Signed)
Obstetrical Discharge Summary  Patient Name: Susan Oneal DOB: December 12, 1990 MRN: [id number]  Date of Admission: 02/10/2023 Date of Delivery: 02/10/23 Delivered by: Beverly Gust MD Date of Discharge: 02/13/23  Primary OB: [facility] [id number] last menstrual period was 05/07/2022. EDC Estimated Date of Delivery: 02/11/23 Gestational Age at Delivery: [redacted]w[redacted]d   Antepartum complications: poor prenatal care  Admitting Diagnosis:  Secondary Diagnosis: Patient Active Problem List   Diagnosis Date Noted   Previous cesarean delivery affecting pregnancy 02/10/2023   PPH (postpartum hemorrhage) 02/10/2023   Supervision of other high risk pregnancy, antepartum 12/15/2022   Supervision of high risk pregnancy, antepartum 11/10/2018   Previous cesarean section complicating pregnancy 11/10/2018   RUQ pain 11/09/2018    Augmentation: N/A Complications: Hemorrhage>1015mL Intrapartum complications/course: significant uterine adhesions PPH qbl 1930 cc Date of Delivery: 02/13/2023  Delivered By: Beverly Gust MD Delivery Type: repeat cesarean section, low transverse incision Anesthesia:spinal Placenta: manual Laceration:  Episiotomy: none Newborn Data: Female  Apgars 8/9.   Postpartum Procedures: blood transfusion  Edinburgh:     02/12/2023    9:54 PM  Edinburgh Postnatal Depression Scale Screening Tool  I have been able to laugh and see the funny side of things. 0  I have looked forward with enjoyment to things. 0  I have blamed myself unnecessarily when things went wrong. 2  I have been anxious or worried for no good reason. 3  I have felt scared or panicky for no good reason. 2  Things have been getting on top of me. 2  I have been so unhappy that I have had difficulty sleeping. 1  I have felt sad or miserable. 1  I have been so unhappy that I have been crying. 1  The thought of harming myself has occurred to me. 0  Edinburgh Postnatal Depression Scale Total 12     (Cesarean Section):  Patient had an uncomplicated postpartum course.  By time of discharge on POD#2, her pain was controlled on oral pain medications; she had appropriate lochia and was ambulating, voiding without difficulty, tolerating regular diet and passing flatus.   She was deemed stable for discharge to home.    Discharge Physical Exam:  BP (!) 105/57 (BP Location: Right Arm)   Pulse (!) 101   Temp 98.9 F (37.2 C) (Oral)   Resp 18   Ht 5\' 4"  (1.626 m)   Wt 94 kg   LMP 05/07/2022   SpO2 97%   Breastfeeding Unknown   BMI 35.57 kg/m   General: NAD CV: RRR Pulm: CTABL, nl effort ABD: s/nd/nt, fundus firm and below the umbilicus Lochia: moderate Incision: c/d/i DVT Evaluation: LE non-ttp, no evidence of DVT on exam.  Hemoglobin  Date Value Ref Range Status  02/13/2023 8.1 (L) 12.0 - 15.0 g/dL Final   HCT  Date Value Ref Range Status  02/13/2023 25.9 (L) 36.0 - 46.0 % Final     Disposition: stable, discharge to home. Baby Feeding: formula Baby Disposition: home with mom  Rh Immune globulin given:  Rubella vaccine given:  Tdap vaccine given in AP or PP setting: no Flu vaccine given in AP or PP setting: no  Contraception: OCP'S  Prenatal Labs:   ABO, Rh: --/--/O POS Performed at [facility], [address] [place], [place] [address]  [phone]) Antibody:  neg  Rubella:  Imm/ vz / imm RPR:   NR HBsAg:   neg HIV:   neg GBS:   neg    Plan:  Susan Oneal was discharged to home in good condition. Follow-up appointment with delivering provider in 6 weeks.  Discharge Medications: Allergies as of 02/13/2023       Reactions   Tape Itching, Rash   Medical tape from C/S        Medication List     STOP taking these medications    PRENATAL GUMMIES PO       TAKE these medications    acetaminophen 500 MG tablet Commonly known as: TYLENOL Take 2 tablets (1,000 mg total) by mouth every 6 (six) hours.   coconut oil Oil Apply 1  Application topically as needed.   dibucaine 1 % Oint Commonly known as: NUPERCAINAL Place 1 Application rectally as needed for hemorrhoids.   enoxaparin 60 MG/0.6ML injection Commonly known as: LOVENOX Inject 0.4 mLs (40 mg total) into the skin daily for 21 days. Start taking on: Feb 14, 2023   ferrous sulfate 325 (65 FE) MG tablet Take 1 tablet (325 mg total) by mouth 2 (two) times daily with a meal.   ibuprofen 600 MG tablet Commonly known as: ADVIL Take 1 tablet (600 mg total) by mouth every 6 (six) hours.   oxyCODONE 5 MG immediate release tablet Commonly known as: Oxy IR/ROXICODONE Take 1-2 tablets (5-10 mg total) by mouth every 4 (four) hours as needed for moderate pain.   senna-docusate 8.6-50 MG tablet Commonly known as: Senokot-S Take 2 tablets by mouth daily. Start taking on: Feb 14, 2023   simethicone 80 MG chewable tablet Commonly known as: MYLICON Chew 1 tablet (80 mg total) by mouth as needed for flatulence.   witch hazel-glycerin pad Commonly known as: TUCKS Apply 1 Application topically as needed for hemorrhoids.         Follow-up Information     Schermerhorn, Ihor Austin, MD Follow up in 2 week(s).   Specialty: Obstetrics and Gynecology Why: post op and mood check Contact information: [address] [place] [address] [phone]                 Signed:  Chari Manning CNM

## 2023-02-10 NOTE — Brief Op Note (Signed)
02/10/2023  9:40 AM  PATIENT:  Susan Oneal  32 y.o. female  PRE-OPERATIVE DIAGNOSIS:  elective repeat cesarean x 4   POST-OPERATIVE DIAGNOSIS:  elective repeat Extensive uterine adhesions  Postpartum hemorrhage  PROCEDURE:  Procedure(s): REPEAT CESAREAN SECTION (N/A)  SURGEON:  Surgeon(s) and Role:    * Clifton Kovacic, Ihor Austin, MD - Primary  PHYSICIAN ASSISTANT: jennifer Oxley  ASSISTANTS:none   ANESTHESIA:   spinal  EBL: QBL   1930 mL  IOF 1200 cc   UO 175cc   BLOOD ADMINISTERED:none  DRAINS: Urinary Catheter (Foley)   LOCAL MEDICATIONS USED:  MARCAINE     SPECIMEN:  No Specimen  DISPOSITION OF SPECIMEN:  N/A  COUNTS:  YES  TOURNIQUET:  * No tourniquets in log *  DICTATION: .Other Dictation: Dictation Number verbal  PLAN OF CARE: Admit to inpatient   PATIENT DISPOSITION:  PACU - hemodynamically stable.   Delay start of Pharmacological VTE agent (>24hrs) due to surgical blood loss or risk of bleeding: not applicable

## 2023-02-11 ENCOUNTER — Encounter: Payer: Self-pay | Admitting: Obstetrics and Gynecology

## 2023-02-11 LAB — CBC
HCT: 22 % — ABNORMAL LOW (ref 36.0–46.0)
Hemoglobin: 7 g/dL — ABNORMAL LOW (ref 12.0–15.0)
MCH: 24.9 pg — ABNORMAL LOW (ref 26.0–34.0)
MCHC: 31.8 g/dL (ref 30.0–36.0)
MCV: 78.3 fL — ABNORMAL LOW (ref 80.0–100.0)
Platelets: 216 10*3/uL (ref 150–400)
RBC: 2.81 MIL/uL — ABNORMAL LOW (ref 3.87–5.11)
RDW: 16.4 % — ABNORMAL HIGH (ref 11.5–15.5)
WBC: 16.7 10*3/uL — ABNORMAL HIGH (ref 4.0–10.5)
nRBC: 0 % (ref 0.0–0.2)

## 2023-02-11 LAB — PREPARE RBC (CROSSMATCH)

## 2023-02-11 LAB — HEMOGLOBIN AND HEMATOCRIT, BLOOD
HCT: 21.9 % — ABNORMAL LOW (ref 36.0–46.0)
Hemoglobin: 6.7 g/dL — ABNORMAL LOW (ref 12.0–15.0)

## 2023-02-11 LAB — TYPE AND SCREEN: Antibody Screen: NEGATIVE

## 2023-02-11 MED ORDER — KETOROLAC TROMETHAMINE 30 MG/ML IJ SOLN: 30.0000 mg | Freq: Once | INTRAMUSCULAR | Status: DC

## 2023-02-11 MED ORDER — KETOROLAC TROMETHAMINE 30 MG/ML IJ SOLN
30.0000 mg | Freq: Once | INTRAMUSCULAR | Status: AC
  Administered 2023-02-12: 30 mg via INTRAVENOUS
  Filled 2023-02-11: qty 1

## 2023-02-11 MED ORDER — FERROUS SULFATE 325 (65 FE) MG PO TABS
325.0000 mg | ORAL_TABLET | Freq: Two times a day (BID) | ORAL | Status: DC
  Administered 2023-02-11 – 2023-02-13 (×5): 325 mg via ORAL
  Filled 2023-02-11 (×5): qty 1

## 2023-02-11 MED ORDER — HYDROXYZINE HCL 25 MG PO TABS
25.0000 mg | ORAL_TABLET | Freq: Four times a day (QID) | ORAL | Status: DC | PRN
  Administered 2023-02-11: 25 mg via ORAL
  Filled 2023-02-11: qty 1

## 2023-02-11 MED ORDER — SODIUM CHLORIDE 0.9% IV SOLUTION: Freq: Once | INTRAVENOUS | Status: AC

## 2023-02-11 MED ORDER — SODIUM CHLORIDE 0.9 % IV SOLN
300.0000 mg | INTRAVENOUS | Status: DC
  Administered 2023-02-11: 300 mg via INTRAVENOUS
  Filled 2023-02-11: qty 300

## 2023-02-11 NOTE — Progress Notes (Signed)
Pt now ready to discuss questions/concerns regarding blood transfusion with CNM; RN contacted CNM via secure chat; CNM (a. Mackey) will be over from L&D as soon as she's available

## 2023-02-11 NOTE — Progress Notes (Signed)
Postop Day  1  Subjective: up ad lib, voiding, and tolerating PO  Doing well. Ambulating without difficulty, pain managed with PO meds, tolerating regular diet, and voiding without difficulty.  Reports some dizziness last night when ambulating but feeling better this morning.   No fever/chills, chest pain, shortness of breath, nausea/vomiting, or leg pain. No nipple or breast pain. No headache, visual changes, or RUQ/epigastric pain.  Objective: BP (!) 98/57 (BP Location: Right Arm)   Pulse 80   Temp 98 F (36.7 C) (Oral)   Resp 20   Ht 5\' 4"  (1.626 m)   Wt 94 kg   LMP 05/07/2022   SpO2 98%   Breastfeeding Unknown   BMI 35.57 kg/m    Physical Exam:  General: alert, cooperative, no distress, and pale Breasts: soft/nontender CV: RRR Pulm: nl effort, CTABL Abdomen: soft, non-tender, active bowel sounds Uterine Fundus: firm Incision: no significant drainage, covered with pressure dressing  Perineum: minimal edema, intact Lochia: appropriate DVT Evaluation: No evidence of DVT seen on physical exam.  Recent Labs    02/10/23 1504 02/11/23 0602  HGB 8.4* 7.0*  HCT 26.9* 22.0*  WBC 19.4* 16.7*  PLT 237 216    Assessment/Plan: 32 y.o. [id number] postpartum day # 1  -Continue routine postpartum care -Lactation consult PRN for breastfeeding  -Acute blood loss anemia - hemodynamically stable and asymptomatic; start PO ferrous sulfate BID with stool softeners  -S/P 1 dose of venofer  -Discussed blood transfusion for acute blood loss anemia.  She is currently asymptomatic.  Will repeat H&H this evening.  Recommend blood transfusion if H&H continuing to decrease or she becomes symptomatic. Risk/benefits reviewed.  She consents to blood if indicated. -Immunization status:   all immunizations up to date   Disposition: Continue inpatient postpartum care    LOS: 1 day   Gustavo Lah, Ina Homes 02/11/2023, 9:50 AM   ----- Margaretmary Eddy  Certified Nurse Midwife [facility]  OB/GYN [facility]

## 2023-02-11 NOTE — Progress Notes (Signed)
S: would like to discuss blood transfusion. States that she is starting to feel better.  Not having any more dizzy spells when she gets up.  Was able to get rest today and not feeling overly fatigued.  Would like to discuss delaying blood transfusion.   O:  height is 5\' 4"  (1.626 m) and weight is 94 kg. Her oral temperature is 98.7 F (37.1 C). Her blood pressure is 106/55 (abnormal) and her pulse is 75. Her respiration is 18 and oxygen saturation is 100%.      Latest Ref Rng & Units 02/11/2023    5:14 PM 02/11/2023    6:02 AM 02/10/2023    3:04 PM  CBC  WBC 4.0 - 10.5 K/uL  16.7  19.4   Hemoglobin 12.0 - 15.0 g/dL 6.7  7.0  8.4   Hematocrit 36.0 - 46.0 % 21.9  22.0  26.9   Platelets 150 - 400 K/uL  216  237     A/P Acute blood loss anemia -Risk/benefits reviewed again  -Reassuring that she is starting to feel better though she has not ambulated outside of her room today.   -Vital signs have been stable  -Recommend repeating CBC in AM -Plan to get up to shower tomorrow and ambulate in hallways.   -Recommend blood transfusion if she becomes symptomatic again or H&H decreasing.   Margaretmary Eddy, CNM Certified Nurse Midwife [facility]    Agree with 1 unit blood transfusion

## 2023-02-11 NOTE — Anesthesia Post-op Follow-up Note (Signed)
  Anesthesia Pain Follow-up Note  Patient: Susan Oneal  Day #: 1  Date of Follow-up: 02/11/2023 Time: 7:28 AM  Last Vitals:  Vitals:   02/11/23 0400 02/11/23 0500  BP: (!) 101/55   Pulse: 68 72  Resp: 18   Temp: 36.5 C   SpO2: 98% 97%    Level of Consciousness: alert  Pain: none   Side Effects:None  Catheter Site Exam:clean, dry  Anti-Coag Meds (From admission, onward)    Start     Dose/Rate Route Frequency Ordered Stop   02/11/23 0800  enoxaparin (LOVENOX) injection 50 mg        50 mg Subcutaneous Every 24 hours 02/10/23 1313          Plan: D/C from anesthesia care at surgeon's request  Starling Manns

## 2023-02-11 NOTE — Progress Notes (Signed)
Kathrin Ruddy, CNM, in patient's room to discuss risks/benefits of blood transfusion and potential risks of declining blood transfusion; pt's son just arrived to visit patient; CNM asked pt to let RN know when she can return to discuss blood transfusion questions

## 2023-02-11 NOTE — Anesthesia Postprocedure Evaluation (Signed)
Anesthesia Post Note  Patient: Susan Oneal  Procedure(s) Performed: REPEAT CESAREAN SECTION  Patient location during evaluation: Mother Baby Anesthesia Type: Spinal Level of consciousness: oriented and awake and alert Pain management: pain level controlled Vital Signs Assessment: post-procedure vital signs reviewed and stable Respiratory status: spontaneous breathing and respiratory function stable Cardiovascular status: blood pressure returned to baseline and stable Postop Assessment: no headache, no backache, no apparent nausea or vomiting and able to ambulate Anesthetic complications: no  No notable events documented.   Last Vitals:  Vitals:   02/11/23 0400 02/11/23 0500  BP: (!) 101/55   Pulse: 68 72  Resp: 18   Temp: 36.5 C   SpO2: 98% 97%    Last Pain:  Vitals:   02/11/23 0511  TempSrc:   PainSc: 5                  Starling Manns

## 2023-02-11 NOTE — Progress Notes (Signed)
RN from day shift and night shift in pt's room doing bedside report; RNs informed pt of her most recent HGB (6.7); day RN has already informed CNM Kathrin Ruddy) of result; CNM would like to do a blood transfusion; pt still has several questions and wants to talk to CNM before deciding about whether to do transfusion or not

## 2023-02-12 LAB — CBC
HCT: 20.1 % — ABNORMAL LOW (ref 36.0–46.0)
Hemoglobin: 6.2 g/dL — ABNORMAL LOW (ref 12.0–15.0)
MCH: 24.5 pg — ABNORMAL LOW (ref 26.0–34.0)
MCHC: 30.8 g/dL (ref 30.0–36.0)
MCV: 79.4 fL — ABNORMAL LOW (ref 80.0–100.0)
Platelets: 211 10*3/uL (ref 150–400)
RBC: 2.53 MIL/uL — ABNORMAL LOW (ref 3.87–5.11)
RDW: 16.7 % — ABNORMAL HIGH (ref 11.5–15.5)
WBC: 14.6 10*3/uL — ABNORMAL HIGH (ref 4.0–10.5)
nRBC: 0.4 % — ABNORMAL HIGH (ref 0.0–0.2)

## 2023-02-12 LAB — CBC WITH DIFFERENTIAL/PLATELET
Abs Immature Granulocytes: 0.42 10*3/uL — ABNORMAL HIGH (ref 0.00–0.07)
Basophils Absolute: 0 10*3/uL (ref 0.0–0.1)
Basophils Relative: 0 %
Eosinophils Absolute: 0.3 10*3/uL (ref 0.0–0.5)
Eosinophils Relative: 2 %
HCT: 23.5 % — ABNORMAL LOW (ref 36.0–46.0)
Hemoglobin: 7.4 g/dL — ABNORMAL LOW (ref 12.0–15.0)
Immature Granulocytes: 3 %
Lymphocytes Relative: 19 %
Lymphs Abs: 2.9 10*3/uL (ref 0.7–4.0)
MCH: 25.5 pg — ABNORMAL LOW (ref 26.0–34.0)
MCHC: 31.5 g/dL (ref 30.0–36.0)
MCV: 81 fL (ref 80.0–100.0)
Monocytes Absolute: 1 10*3/uL (ref 0.1–1.0)
Monocytes Relative: 7 %
Neutro Abs: 11.2 10*3/uL — ABNORMAL HIGH (ref 1.7–7.7)
Neutrophils Relative %: 69 %
Platelets: 213 10*3/uL (ref 150–400)
RBC: 2.9 MIL/uL — ABNORMAL LOW (ref 3.87–5.11)
RDW: 16.5 % — ABNORMAL HIGH (ref 11.5–15.5)
WBC: 15.9 10*3/uL — ABNORMAL HIGH (ref 4.0–10.5)
nRBC: 0.4 % — ABNORMAL HIGH (ref 0.0–0.2)

## 2023-02-12 LAB — TYPE AND SCREEN

## 2023-02-12 LAB — BPAM RBC

## 2023-02-12 MED ORDER — ACETAMINOPHEN 500 MG PO TABS
1000.0000 mg | ORAL_TABLET | Freq: Four times a day (QID) | ORAL | Status: DC
  Administered 2023-02-12 – 2023-02-13 (×4): 1000 mg via ORAL
  Filled 2023-02-12 (×5): qty 2

## 2023-02-13 LAB — TYPE AND SCREEN: Unit division: 0

## 2023-02-13 LAB — CBC
HCT: 25.9 % — ABNORMAL LOW (ref 36.0–46.0)
Hemoglobin: 8.1 g/dL — ABNORMAL LOW (ref 12.0–15.0)
MCH: 25.2 pg — ABNORMAL LOW (ref 26.0–34.0)
MCHC: 31.3 g/dL (ref 30.0–36.0)
MCV: 80.7 fL (ref 80.0–100.0)
Platelets: 256 10*3/uL (ref 150–400)
RBC: 3.21 MIL/uL — ABNORMAL LOW (ref 3.87–5.11)
RDW: 17 % — ABNORMAL HIGH (ref 11.5–15.5)
WBC: 16.3 10*3/uL — ABNORMAL HIGH (ref 4.0–10.5)
nRBC: 0.5 % — ABNORMAL HIGH (ref 0.0–0.2)

## 2023-02-13 LAB — BPAM RBC
Blood Product Expiration Date: 202406152359
ISSUE DATE / TIME: 202405160840
Unit Type and Rh: 5100

## 2023-02-13 MED ORDER — IBUPROFEN 600 MG PO TABS: 600.0000 mg | ORAL_TABLET | Freq: Four times a day (QID) | ORAL | 0 refills | Status: AC

## 2023-02-13 MED ORDER — SENNOSIDES-DOCUSATE SODIUM 8.6-50 MG PO TABS: 2.0000 | ORAL_TABLET | Freq: Every day | ORAL | Status: AC

## 2023-02-13 MED ORDER — ACETAMINOPHEN 500 MG PO TABS: 1000.0000 mg | ORAL_TABLET | Freq: Four times a day (QID) | ORAL | 0 refills | Status: AC

## 2023-02-13 MED ORDER — ENOXAPARIN SODIUM 60 MG/0.6ML IJ SOSY: 40.0000 mg | PREFILLED_SYRINGE | INTRAMUSCULAR | 0 refills | Status: AC

## 2023-02-13 MED ORDER — FERROUS SULFATE 325 (65 FE) MG PO TABS: 325.0000 mg | ORAL_TABLET | Freq: Two times a day (BID) | ORAL | 3 refills | Status: AC

## 2023-02-13 MED ORDER — WITCH HAZEL-GLYCERIN EX PADS: 1.0000 | MEDICATED_PAD | CUTANEOUS | 12 refills | Status: AC | PRN

## 2023-02-13 MED ORDER — OXYCODONE HCL 5 MG PO TABS: 5.0000 mg | ORAL_TABLET | ORAL | 0 refills | Status: AC | PRN

## 2023-02-13 MED ORDER — DIBUCAINE (PERIANAL) 1 % EX OINT: 1.0000 | TOPICAL_OINTMENT | CUTANEOUS | Status: AC | PRN

## 2023-02-13 MED ORDER — SIMETHICONE 80 MG PO CHEW: 80.0000 mg | CHEWABLE_TABLET | ORAL | 0 refills | Status: AC | PRN

## 2023-02-13 MED ORDER — COCONUT OIL OIL: 1.0000 | TOPICAL_OIL | 0 refills | Status: AC | PRN

## 2023-02-13 NOTE — Discharge Instructions (Signed)

## 2023-02-13 NOTE — Clinical Social Work Maternal (Signed)
  CLINICAL SOCIAL WORK MATERNAL/CHILD NOTE  Patient Details  Name: Susan Oneal MRN: [id number] Date of Birth: 12/04/1990  Date:  02/13/2023  Clinical Social Worker Initiating Note:  Darolyn Rua, [place] Date/Time: Initiated:  02/13/23/1220     Child's Name:  Susan Oneal   Biological Parents:  Mother, Father   Need for Interpreter:  None   Reason for Referral:   (late prenatal care, high depressionscore)   Address:  [address] [place] [address]    Phone number:  [phone] (home)     Additional phone number:   Household Members/Support Persons (HM/SP):       HM/SP Name Relationship DOB or Age  HM/SP -1        HM/SP -2        HM/SP -3        HM/SP -4        HM/SP -5        HM/SP -6        HM/SP -7        HM/SP -8          Natural Supports (not living in the home):      Professional Supports:     Employment:     Type of Work:     Education:      Homebound arranged:    Architect:      Other Resources:      Cultural/Religious Considerations Which May Impact Care:    Strengths:  Ability to meet basic needs  , Compliance with medical plan     Psychotropic Medications:         Pediatrician:       Pediatrician List:   [facility]      Pediatrician Fax Number:    Risk Factors/Current Problems:      Cognitive State:  Alert     Mood/Affect:  Calm     CSW Assessment:   Patient confirmed address and phone number in chart as accurate. Reports FOB is Humberto Seals, child's name is Susan Oneal. Reports will be going home with her mother who identifies as support system including a friend who will transport at discharge. She reports hx of depression, hx of therapy and reports understanding to go to PCP should she express PPD symptoms. Patient aware of safe sleep habits and SIDs, reports no SI/HI Reports she has a bassinett and car seat for child, Liberty Handy  will go to [facility] in [place] for pediatrician apt. Reports no needs for discharge at this time.   CSW Plan/Description:  No Further Intervention Required/No Barriers to Discharge    Darolyn Rua, LCSW 02/13/2023, 12:37 PM
